# Patient Record
Sex: Female | Born: 1955 | Race: Black or African American | Hispanic: No | Marital: Single | Smoking: Former smoker
Health system: Southern US, Community
[De-identification: ages and names within clinical notes are randomized; demographics above are authoritative.]

## PROBLEM LIST (undated history)

## (undated) ENCOUNTER — Emergency Department (HOSPITAL_COMMUNITY): Discharge status: Against Medical Advice | Payer: No Typology Code available for payment source

## (undated) DIAGNOSIS — J45909 Unspecified asthma, uncomplicated: Secondary | ICD-10-CM

## (undated) DIAGNOSIS — J449 Chronic obstructive pulmonary disease, unspecified: Secondary | ICD-10-CM

## (undated) DIAGNOSIS — I10 Essential (primary) hypertension: Secondary | ICD-10-CM

## (undated) HISTORY — PX: BREAST REDUCTION SURGERY: SHX8

---

## 2021-08-02 ENCOUNTER — Ambulatory Visit (HOSPITAL_COMMUNITY)
Admission: EM | Admit: 2021-08-02 | Discharge: 2021-08-02 | Disposition: A | Discharge status: Home / Self Care | Payer: No Typology Code available for payment source | Attending: Physician Assistant | Admitting: Physician Assistant

## 2021-08-02 ENCOUNTER — Other Ambulatory Visit: Payer: Self-pay

## 2021-08-02 ENCOUNTER — Encounter (HOSPITAL_COMMUNITY): Payer: Self-pay

## 2021-08-02 ENCOUNTER — Ambulatory Visit (INDEPENDENT_AMBULATORY_CARE_PROVIDER_SITE_OTHER): Payer: No Typology Code available for payment source

## 2021-08-02 DIAGNOSIS — J439 Emphysema, unspecified: Secondary | ICD-10-CM

## 2021-08-02 DIAGNOSIS — R0602 Shortness of breath: Secondary | ICD-10-CM | POA: Insufficient documentation

## 2021-08-02 DIAGNOSIS — R002 Palpitations: Secondary | ICD-10-CM | POA: Insufficient documentation

## 2021-08-02 HISTORY — DX: Essential (primary) hypertension: I10

## 2021-08-02 HISTORY — DX: Chronic obstructive pulmonary disease, unspecified: J44.9

## 2021-08-02 LAB — COMPREHENSIVE METABOLIC PANEL
ALT: 12 U/L (ref 0–44)
AST: 17 U/L (ref 15–41)
Albumin: 3.5 g/dL (ref 3.5–5.0)
Alkaline Phosphatase: 59 U/L (ref 38–126)
Anion gap: 5 (ref 5–15)
BUN: 12 mg/dL (ref 8–23)
CO2: 32 mmol/L (ref 22–32)
Calcium: 9.9 mg/dL (ref 8.9–10.3)
Chloride: 102 mmol/L (ref 98–111)
Creatinine, Ser: 0.8 mg/dL (ref 0.44–1.00)
GFR, Estimated: >60 mL/min (ref >60)
Glucose, Bld: 97 mg/dL (ref 70–99)
Potassium: 3.2 mmol/L — ABNORMAL LOW (ref 3.5–5.1)
Sodium: 139 mmol/L (ref 135–145)
Total Bilirubin: 0.7 mg/dL (ref 0.3–1.2)
Total Protein: 6.6 g/dL (ref 6.5–8.1)

## 2021-08-02 LAB — POCT URINALYSIS DIPSTICK, ED / UC
Bilirubin Urine: NEGATIVE
Glucose, UA: NEGATIVE mg/dL
Hgb urine dipstick: NEGATIVE
Nitrite: NEGATIVE
Protein, ur: NEGATIVE mg/dL
Specific Gravity, Urine: 1.025 (ref 1.005–1.030)
Urobilinogen, UA: 0.2 mg/dL (ref 0.0–1.0)
pH: 6 (ref 5.0–8.0)

## 2021-08-02 LAB — CBC WITH DIFFERENTIAL/PLATELET
Abs Immature Granulocytes: 0.02 10*3/uL (ref 0.00–0.07)
Basophils Absolute: 0 10*3/uL (ref 0.0–0.1)
Basophils Relative: 0 %
Eosinophils Absolute: 0.3 10*3/uL (ref 0.0–0.5)
Eosinophils Relative: 4 %
HCT: 39.1 % (ref 36.0–46.0)
Hemoglobin: 13.9 g/dL (ref 12.0–15.0)
Immature Granulocytes: 0 %
Lymphocytes Relative: 37 %
Lymphs Abs: 2.6 10*3/uL (ref 0.7–4.0)
MCH: 29.3 pg (ref 26.0–34.0)
MCHC: 35.5 g/dL (ref 30.0–36.0)
MCV: 82.3 fL (ref 80.0–100.0)
Monocytes Absolute: 0.7 10*3/uL (ref 0.1–1.0)
Monocytes Relative: 10 %
Neutro Abs: 3.3 10*3/uL (ref 1.7–7.7)
Neutrophils Relative %: 49 %
Platelets: 250 10*3/uL (ref 150–400)
RBC: 4.75 MIL/uL (ref 3.87–5.11)
RDW: 14 % (ref 11.5–15.5)
WBC: 6.9 10*3/uL (ref 4.0–10.5)
nRBC: 0 % (ref 0.0–0.2)

## 2021-08-02 LAB — TSH: TSH: 0.916 u[IU]/mL (ref 0.350–4.500)

## 2021-08-02 MED ORDER — BUDESONIDE-FORMOTEROL FUMARATE 80-4.5 MCG/ACT IN AERO: 2.0000 | INHALATION_SPRAY | Freq: Two times a day (BID) | RESPIRATORY_TRACT | 0 refills | Status: DC

## 2021-08-02 NOTE — Discharge Instructions (Signed)
Your x-ray showed COPD but was otherwise normal.  Your EKG was normal.  I am going to start you on a medicine to help with your COPD stating this can be contributing to your symptoms.  Please monitor prednisone as a cause of your symptoms and use this sparingly.  We will contact you if your lab work is abnormal.  Make sure you are drinking plenty of fluid.  Follow-up with PCP.  If anything worsens and you have shortness of breath, worsening cough, fever, heart racing, lightheadedness, feeling like you are to pass out you need to go to the emergency room.

## 2021-08-02 NOTE — ED Provider Notes (Signed)
[facility]    CSN: [id number] Arrival date & time: 08/02/21  1520      History   Chief Complaint No chief complaint on file.   HPI Helen Vasquez is a 64 y.o. female.   Patient presents today for evaluation of several days of elevated heart rate.  Reports that she is recently diagnosed with COPD but is not currently taking any bronchodilating medications/inhalers.  She has been prescribed prednisone which she uses as needed with last dose approximately 3 to 4 days ago.  She does report ongoing shortness of breath but is unsure if this is related to her COPD or something else.  She denies any chest pain, lightheadedness, syncope, dizziness, nausea, vomiting.  She denies history of thyroid condition or anemia.  Denies any abnormal uterine bleeding, melena, hematochezia, bruising.  She denies history of arrhythmia or cardiovascular disease.  She denies any decongestant use, increased caffeine consumption.  Denies additional medication or dietary changes.   Past Medical History:  Diagnosis Date   COPD (chronic obstructive pulmonary disease) (HCC)    High blood pressure     There are no problems to display for this patient.   History reviewed. No pertinent surgical history.  OB History   No obstetric history on file.      Home Medications    Prior to Admission medications   Medication Sig Start Date End Date Taking? Authorizing Provider  budesonide-formoterol (SYMBICORT) 80-4.5 MCG/ACT inhaler Inhale 2 puffs into the lungs in the morning and at bedtime. 08/02/21  Yes Andras Grunewald, Noberto Retort, PA-C    Family History History reviewed. No pertinent family history.  Social History Social History   Tobacco Use   Smoking status: Some Days    Types: Cigarettes   Smokeless tobacco: Never  Substance Use Topics   Alcohol use: Never   Drug use: Never     Allergies   Patient has no known allergies.   Review of Systems Review of Systems  Constitutional:  Positive for  activity change. Negative for appetite change, fatigue and fever.  HENT:  Negative for congestion, sinus pressure, sneezing and sore throat.   Respiratory:  Positive for shortness of breath. Negative for cough.   Cardiovascular:  Positive for palpitations. Negative for chest pain.  Gastrointestinal:  Negative for abdominal pain, diarrhea, nausea and vomiting.  Neurological:  Negative for dizziness, seizures, facial asymmetry, weakness, light-headedness, numbness and headaches.    Physical Exam Triage Vital Signs ED Triage Vitals  Enc Vitals Group     BP 08/02/21 1642 140/72     Pulse Rate 08/02/21 1642 100     Resp 08/02/21 1642 16     Temp --      Temp src --      SpO2 08/02/21 1642 92 %     Weight --      Height --      Head Circumference --      Peak Flow --      Pain Score 08/02/21 1644 0     Pain Loc --      Pain Edu? --      Excl. in GC? --    No data found.  Updated Vital Signs BP 140/72 (BP Location: Left Arm)   Pulse 100   Temp 98.2 F (36.8 C) (Oral)   Resp 16   SpO2 92%   Visual Acuity Right Eye Distance:   Left Eye Distance:   Bilateral Distance:    Right Eye Near:  Left Eye Near:    Bilateral Near:     Physical Exam Vitals reviewed.  Constitutional:      General: She is awake. She is not in acute distress.    Appearance: Normal appearance. She is well-developed. She is not ill-appearing.     Comments: Very pleasant female appears stated age in no acute distress sitting comfortably in exam room  HENT:     Head: Normocephalic and atraumatic.     Mouth/Throat:     Mouth: Mucous membranes are moist.  Eyes:     General: Lids are normal.  Cardiovascular:     Rate and Rhythm: Regular rhythm. Tachycardia present.     Heart sounds: Normal heart sounds, S1 normal and S2 normal. No murmur heard. Pulmonary:     Effort: Pulmonary effort is normal.     Breath sounds: Wheezing present. No rhonchi or rales.     Comments: Scattered wheezing Abdominal:      Palpations: Abdomen is soft.     Tenderness: There is no abdominal tenderness.  Psychiatric:        Behavior: Behavior is cooperative.     UC Treatments / Results  Labs (all labs ordered are listed, but only abnormal results are displayed) Labs Reviewed  POCT URINALYSIS DIPSTICK, ED / UC - Abnormal; Notable for the following components:      Result Value   Ketones, ur TRACE (*)    Leukocytes,Ua SMALL (*)    All other components within normal limits  URINE CULTURE  CBC WITH DIFFERENTIAL/PLATELET  COMPREHENSIVE METABOLIC PANEL  TSH    EKG   Radiology DG Chest 2 View  Result Date: 08/02/2021 CLINICAL DATA:  Short of breath, palpitations EXAM: CHEST - 2 VIEW COMPARISON:  None. FINDINGS: Frontal and lateral views of the chest demonstrate an unremarkable cardiac silhouette. Lungs are hyperinflated, with mild diffuse interstitial prominence most consistent with emphysema. No focal airspace disease, effusion, or pneumothorax. No acute bony abnormalities. IMPRESSION: 1. Emphysema.  No acute airspace disease. Electronically Signed   By: Sharlet Salina M.D.   On: 08/02/2021 17:18    Procedures Procedures (including critical care time)  Medications Ordered in UC Medications - No data to display  Initial Impression / Assessment and Plan / UC Course  I have reviewed the triage vital signs and the nursing notes.  Pertinent labs & imaging results that were available during my care of the patient were reviewed by me and considered in my medical decision making (see chart for details).     EKG obtained showed normal sinus rhythm with left axis deviation and ventricular rate of 91 bpm with no ischemic changes and late R wave progression; no previous to compare.  Chest x-ray was obtained that showed emphysema without acute changes.  UA showed no significant evidence of dehydration but did show small leukocyte esterase.  Patient denies any significant symptoms so we will send this for culture  before starting antibiotics.  TSH, CBC, CMP obtained today-results pending.  Suspect symptoms are related to prednisone she was encouraged to limit use of this medication.  We will start her on a LABA/inhaled corticosteroid combination (Symbicort) for COPD.  She was instructed to rinse her mouth following use of this medication to prevent thrush.  She does have a primary care provider at [facility] was strongly encouraged to follow-up with them as soon as possible.  Discussed at length alarm symptoms that warrant emergent evaluation.  Strict return precautions given to which she expressed understanding.  Final Clinical Impressions(s) / UC Diagnoses   Final diagnoses:  SOB (shortness of breath)  Palpitations  Pulmonary emphysema, unspecified emphysema type [facility])     Discharge Instructions      Your x-ray showed COPD but was otherwise normal.  Your EKG was normal.  I am going to start you on a medicine to help with your COPD stating this can be contributing to your symptoms.  Please monitor prednisone as a cause of your symptoms and use this sparingly.  We will contact you if your lab work is abnormal.  Make sure you are drinking plenty of fluid.  Follow-up with PCP.  If anything worsens and you have shortness of breath, worsening cough, fever, heart racing, lightheadedness, feeling like you are to pass out you need to go to the emergency room.     ED Prescriptions     Medication Sig Dispense Auth. Provider   budesonide-formoterol (SYMBICORT) 80-4.5 MCG/ACT inhaler Inhale 2 puffs into the lungs in the morning and at bedtime. 10.2 g Neilah Fulwider K, PA-C      PDMP not reviewed this encounter.   Jeani Hawking, PA-C 08/02/21 1754

## 2021-08-02 NOTE — ED Triage Notes (Signed)
Pt presents with concerns of her heart rate being elevated. Pt states her HR was at 113 yesterday and stated a 109 in the morning of yesterday.

## 2021-08-04 LAB — URINE CULTURE

## 2021-08-12 ENCOUNTER — Emergency Department (HOSPITAL_COMMUNITY): Payer: No Typology Code available for payment source

## 2021-08-12 ENCOUNTER — Encounter (HOSPITAL_COMMUNITY): Payer: Self-pay | Admitting: Emergency Medicine

## 2021-08-12 ENCOUNTER — Emergency Department (HOSPITAL_COMMUNITY)
Admission: EM | Admit: 2021-08-12 | Discharge: 2021-08-13 | Disposition: A | Discharge status: Home / Self Care | Payer: No Typology Code available for payment source | Attending: Emergency Medicine | Admitting: Emergency Medicine

## 2021-08-12 ENCOUNTER — Other Ambulatory Visit: Payer: Self-pay

## 2021-08-12 DIAGNOSIS — I10 Essential (primary) hypertension: Secondary | ICD-10-CM | POA: Insufficient documentation

## 2021-08-12 DIAGNOSIS — Z7951 Long term (current) use of inhaled steroids: Secondary | ICD-10-CM | POA: Diagnosis not present

## 2021-08-12 DIAGNOSIS — J449 Chronic obstructive pulmonary disease, unspecified: Secondary | ICD-10-CM | POA: Diagnosis not present

## 2021-08-12 DIAGNOSIS — H53123 Transient visual loss, bilateral: Secondary | ICD-10-CM | POA: Insufficient documentation

## 2021-08-12 DIAGNOSIS — F17210 Nicotine dependence, cigarettes, uncomplicated: Secondary | ICD-10-CM | POA: Insufficient documentation

## 2021-08-12 DIAGNOSIS — H539 Unspecified visual disturbance: Secondary | ICD-10-CM

## 2021-08-12 LAB — BASIC METABOLIC PANEL
Anion gap: 8 (ref 5–15)
BUN: 11 mg/dL (ref 8–23)
CO2: 32 mmol/L (ref 22–32)
Calcium: 10 mg/dL (ref 8.9–10.3)
Chloride: 97 mmol/L — ABNORMAL LOW (ref 98–111)
Creatinine, Ser: 1.04 mg/dL — ABNORMAL HIGH (ref 0.44–1.00)
GFR, Estimated: 60 mL/min — ABNORMAL LOW (ref >60)
Glucose, Bld: 121 mg/dL — ABNORMAL HIGH (ref 70–99)
Potassium: 3.2 mmol/L — ABNORMAL LOW (ref 3.5–5.1)
Sodium: 137 mmol/L (ref 135–145)

## 2021-08-12 LAB — CBC WITH DIFFERENTIAL/PLATELET
Abs Immature Granulocytes: 0.07 10*3/uL (ref 0.00–0.07)
Basophils Absolute: 0 10*3/uL (ref 0.0–0.1)
Basophils Relative: 0 %
Eosinophils Absolute: 0.3 10*3/uL (ref 0.0–0.5)
Eosinophils Relative: 2 %
HCT: 41.4 % (ref 36.0–46.0)
Hemoglobin: 14.3 g/dL (ref 12.0–15.0)
Immature Granulocytes: 1 %
Lymphocytes Relative: 26 %
Lymphs Abs: 3.3 10*3/uL (ref 0.7–4.0)
MCH: 28.7 pg (ref 26.0–34.0)
MCHC: 34.5 g/dL (ref 30.0–36.0)
MCV: 83.1 fL (ref 80.0–100.0)
Monocytes Absolute: 1 10*3/uL (ref 0.1–1.0)
Monocytes Relative: 8 %
Neutro Abs: 8.2 10*3/uL — ABNORMAL HIGH (ref 1.7–7.7)
Neutrophils Relative %: 63 %
Platelets: 268 10*3/uL (ref 150–400)
RBC: 4.98 MIL/uL (ref 3.87–5.11)
RDW: 14.1 % (ref 11.5–15.5)
WBC: 12.8 10*3/uL — ABNORMAL HIGH (ref 4.0–10.5)
nRBC: 0 % (ref 0.0–0.2)

## 2021-08-12 LAB — TROPONIN I (HIGH SENSITIVITY)
Troponin I (High Sensitivity): 14 ng/L (ref <18)
Troponin I (High Sensitivity): 19 ng/L — ABNORMAL HIGH (ref <18)

## 2021-08-12 LAB — BRAIN NATRIURETIC PEPTIDE: B Natriuretic Peptide: 66.7 pg/mL (ref 0.0–100.0)

## 2021-08-12 NOTE — ED Triage Notes (Signed)
Pt c/o hypertension and tachycardia. Reports HR 130 and BP 160/66. Hx HTN, states she is taking her meds as prescribed. Denies chest pain/shortness of breath/headache. C/o dizziness.

## 2021-08-12 NOTE — ED Provider Notes (Signed)
Emergency Medicine Provider Triage Evaluation Note  Helen Vasquez , a 65 y.o. female  was evaluated in triage.  Pt complains of hypertension and tachycardia.  Was watching TV, felt like her heart was racing.  Had a headache that started suddenly and was associated with blurred vision.  No chest pain, does feel short of breath.  Was seen in urgent care with similar symptoms 10 days ago, TSH was normal at that time.  No gross electrolyte derangement, was diagnosed with COPD.  No cardiac history, has not seen a cardiologist.  Review of Systems  Positive: Above Negative: Syncope  Physical Exam  BP (!) 155/77 (BP Location: Right Arm)   Pulse (!) 124   Temp 98.3 F (36.8 C) (Oral)   Resp 18   SpO2 96%  Gen:   Awake, no distress   Resp:  Normal effort  MSK:   Moves extremities without difficulty  Other:  Patient is tachycardic, rhythm is regular.  Medical Decision Making  Medically screening exam initiated at 4:59 PM.  Appropriate orders placed.  Helen Vasquez was informed that the remainder of the evaluation will be completed by another provider, this initial triage assessment does not replace that evaluation, and the importance of remaining in the ED until their evaluation is complete.  EKG in triage showed sinus tachycardia with PACs.  We will get shortness of breath labs, troponin to check for signs of heart strain.     Theron Arista, PA-C 08/12/21 1701    Lorre Nick, MD 08/13/21 [phone]

## 2021-08-13 LAB — TROPONIN I (HIGH SENSITIVITY): Troponin I (High Sensitivity): 14 ng/L (ref <18)

## 2021-08-13 MED ORDER — AEROCHAMBER PLUS FLO-VU MISC
1.0000 | Freq: Once | Status: DC
  Filled 2021-08-13: qty 1

## 2021-08-13 NOTE — ED Notes (Signed)
EDP at bedside  

## 2021-08-13 NOTE — ED Provider Notes (Signed)
[facility] EMERGENCY DEPARTMENT Provider Note   CSN: [id number] Arrival date & time: 08/12/21  1559     History Chief Complaint  Patient presents with   Hypertension    Helen Vasquez is a 65 y.o. female.   65 year old female with a history of COPD, hypertension presents to the emergency department for evaluation.  She is a very difficult historian.  Complains, to me, of onset of bilateral blurry vision when watching TV.  Symptoms began around 1530.  They began to improve as she got in her car to come to the emergency department.  While her vision was blurry, she reports checking her vital signs.  She noted that her heart rate was elevated on her home monitor.  This also made her concerned.  Does note that her heart rate will fluctuate at times when she checks her blood pressure at home.  Reported palpitations with her symptoms in triage, but denies this on multiple occasions during my questioning.  States that she has shortness of breath at baseline from her COPD and did not feel this was worse when her vision was blurry.  No associated fevers, headaches, complete vision loss, syncope or near syncope, chest pain.  Was recently placed on prednisone for suspected URI symptoms after being seen at urgent care.  No other recent medication changes, but she expresses difficulty taking her blood pressure medication consistently.  She does endorse taking her medication this morning.  Presently the patient reports to be asymptomatic.  The history is provided by the patient. No language interpreter was used.  Hypertension      Past Medical History:  Diagnosis Date   COPD (chronic obstructive pulmonary disease) (HCC)    High blood pressure     There are no problems to display for this patient.   History reviewed. No pertinent surgical history.   OB History   No obstetric history on file.     No family history on file.  Social History   Tobacco Use   Smoking  status: Some Days    Types: Cigarettes   Smokeless tobacco: Never  Substance Use Topics   Alcohol use: Never   Drug use: Never    Home Medications Prior to Admission medications   Medication Sig Start Date End Date Taking? Authorizing Provider  budesonide-formoterol (SYMBICORT) 80-4.5 MCG/ACT inhaler Inhale 2 puffs into the lungs in the morning and at bedtime. 08/02/21   Raspet, Noberto Retort, PA-C    Allergies    Ace inhibitors and Penicillin g  Review of Systems   Review of Systems Ten systems reviewed and are negative for acute change, except as noted in the HPI.    Physical Exam Updated Vital Signs BP (!) 148/83 (BP Location: Right Arm)   Pulse 91   Temp 98.6 F (37 C)   Resp 18   SpO2 96%   Physical Exam Vitals and nursing note reviewed.  Constitutional:      General: She is not in acute distress.    Appearance: She is well-developed. She is not diaphoretic.     Comments: Obese AA female. Pleasant.  HENT:     Head: Normocephalic and atraumatic.     Right Ear: External ear normal.     Left Ear: External ear normal.  Eyes:     General: No scleral icterus.    Extraocular Movements: Extraocular movements intact.     Conjunctiva/sclera: Conjunctivae normal.  Cardiovascular:     Rate and Rhythm: Normal rate and regular  rhythm.     Pulses: Normal pulses.  Pulmonary:     Effort: Pulmonary effort is normal. No respiratory distress.     Breath sounds: No stridor. No rales.     Comments: Minimal, scattered expiratory wheezes.  No tachypnea or dyspnea.  No rales or rhonchi. Musculoskeletal:        General: Normal range of motion.     Cervical back: Normal range of motion.  Skin:    General: Skin is warm and dry.     Coloration: Skin is not pale.     Findings: No erythema or rash.  Neurological:     Mental Status: She is alert and oriented to person, place, and time.     Coordination: Coordination normal.     Comments: GCS 15. Speech is goal oriented. No cranial nerve  deficits or other focal deficit appreciated. Patient moves extremities without ataxia. Patient ambulatory with steady gait.  Psychiatric:        Behavior: Behavior normal.    ED Results / Procedures / Treatments   Labs (all labs ordered are listed, but only abnormal results are displayed) Labs Reviewed  BASIC METABOLIC PANEL - Abnormal; Notable for the following components:      Result Value   Potassium 3.2 (*)    Chloride 97 (*)    Glucose, Bld 121 (*)    Creatinine, Ser 1.04 (*)    GFR, Estimated 60 (*)    All other components within normal limits  CBC WITH DIFFERENTIAL/PLATELET - Abnormal; Notable for the following components:   WBC 12.8 (*)    Neutro Abs 8.2 (*)    All other components within normal limits  TROPONIN I (HIGH SENSITIVITY) - Abnormal; Notable for the following components:   Troponin I (High Sensitivity) 19 (*)    All other components within normal limits  BRAIN NATRIURETIC PEPTIDE  TROPONIN I (HIGH SENSITIVITY)  TROPONIN I (HIGH SENSITIVITY)    EKG EKG Interpretation  Date/Time:  Monday August 12 2021 16:54:40 EST Ventricular Rate:  117 PR Interval:  166 QRS Duration: 84 QT Interval:  324 QTC Calculation: 451 R Axis:   -45 Text Interpretation: Sinus tachycardia with Premature supraventricular complexes Left axis deviation Low voltage QRS Abnormal ECG Confirmed by Gerlene Fee [phone]) on 08/13/2021 2:22:43 AM  Radiology DG Chest 2 View  Result Date: 08/12/2021 CLINICAL DATA:  Shortness of breath. EXAM: CHEST - 2 VIEW COMPARISON:  Chest x-ray 08/02/2021 FINDINGS: The heart size and mediastinal contours are within normal limits. Both lungs are clear. The visualized skeletal structures are unremarkable. IMPRESSION: No active cardiopulmonary disease. Electronically Signed   By: Ronney Asters M.D.   On: 08/12/2021 17:37   CT Head Wo Contrast  Result Date: 08/12/2021 CLINICAL DATA:  Headache. EXAM: CT HEAD WITHOUT CONTRAST TECHNIQUE: Contiguous axial  images were obtained from the base of the skull through the vertex without intravenous contrast. COMPARISON:  None. FINDINGS: Brain: The ventricles and sulci appropriate size for patient's age. Mild periventricular and deep white matter chronic microvascular ischemic changes noted. There is no acute intracranial hemorrhage. No mass effect or midline shift. No extra-axial fluid collection. Vascular: No hyperdense vessel or unexpected calcification. Skull: Normal. Negative for fracture or focal lesion. Sinuses/Orbits: No acute finding. Other: None IMPRESSION: No acute intracranial pathology. Electronically Signed   By: Anner Crete M.D.   On: 08/12/2021 19:18    Procedures Procedures   Medications Ordered in ED Medications  aerochamber plus with mask device 1 each (has no  administration in time range)    ED Course  I have reviewed the triage vital signs and the nursing notes.  Pertinent labs & imaging results that were available during my care of the patient were reviewed by me and considered in my medical decision making (see chart for details).    MDM Rules/Calculators/A&P                           65 year old female presents to the emergency department for evaluation.  Initially reporting in triage complaints of high blood pressure and an episode of palpitations prior to arrival.  During my encounter, she reports complaints of bilateral blurry vision which transiently resolved.  She is presently asymptomatic.  Very difficult historian given inconsistency of complaints.  Overall, with no focal neurologic deficit.  Had screening labs and a head CT completed in triage.  These have been reviewed and are reassuring.  Third troponin ordered given change from 14 > 19. Additional delta stable at, again, 14.  Given reassuring evaluation in the emergency department, feel the patient can follow-up further with her primary care doctor.  Have advised she continue her daily prescribed medications.   Patient seen in conjunction with MD Bero who has assessed the patient at bedside and is agreeable with plan.  Patient discharged in stable condition.   Final Clinical Impression(s) / ED Diagnoses Final diagnoses:  Transient visual disturbance    Rx / DC Orders ED Discharge Orders     None        Antonietta Breach, PA-C 08/21/21 1629    Maudie Flakes, MD 08/22/21 [phone]

## 2021-08-13 NOTE — Discharge Instructions (Signed)
Your evaluation in the emergency department today was reassuring.  We recommend that you follow-up with a primary care doctor.  Keep your scheduled appointment today for evaluation.  You may return for new or concerning symptoms.

## 2021-09-12 ENCOUNTER — Observation Stay (HOSPITAL_COMMUNITY)
Admission: EM | Admit: 2021-09-12 | Discharge: 2021-09-13 | Disposition: A | Discharge status: Home / Self Care | Payer: No Typology Code available for payment source | Attending: Internal Medicine | Admitting: Internal Medicine

## 2021-09-12 ENCOUNTER — Emergency Department (HOSPITAL_COMMUNITY): Payer: No Typology Code available for payment source

## 2021-09-12 ENCOUNTER — Other Ambulatory Visit: Payer: Self-pay

## 2021-09-12 ENCOUNTER — Encounter (HOSPITAL_COMMUNITY): Payer: Self-pay | Admitting: Emergency Medicine

## 2021-09-12 DIAGNOSIS — Z87891 Personal history of nicotine dependence: Secondary | ICD-10-CM | POA: Insufficient documentation

## 2021-09-12 DIAGNOSIS — J45909 Unspecified asthma, uncomplicated: Secondary | ICD-10-CM | POA: Insufficient documentation

## 2021-09-12 DIAGNOSIS — I10 Essential (primary) hypertension: Secondary | ICD-10-CM | POA: Insufficient documentation

## 2021-09-12 DIAGNOSIS — J441 Chronic obstructive pulmonary disease with (acute) exacerbation: Secondary | ICD-10-CM | POA: Diagnosis not present

## 2021-09-12 DIAGNOSIS — Z20822 Contact with and (suspected) exposure to covid-19: Secondary | ICD-10-CM | POA: Insufficient documentation

## 2021-09-12 DIAGNOSIS — Z79899 Other long term (current) drug therapy: Secondary | ICD-10-CM | POA: Insufficient documentation

## 2021-09-12 DIAGNOSIS — R0902 Hypoxemia: Secondary | ICD-10-CM

## 2021-09-12 DIAGNOSIS — R0602 Shortness of breath: Secondary | ICD-10-CM

## 2021-09-12 DIAGNOSIS — E872 Acidosis, unspecified: Secondary | ICD-10-CM

## 2021-09-12 DIAGNOSIS — E876 Hypokalemia: Secondary | ICD-10-CM

## 2021-09-12 HISTORY — DX: Unspecified asthma, uncomplicated: J45.909

## 2021-09-12 LAB — URINALYSIS, ROUTINE W REFLEX MICROSCOPIC
Bilirubin Urine: NEGATIVE
Glucose, UA: NEGATIVE mg/dL
Hgb urine dipstick: NEGATIVE
Ketones, ur: NEGATIVE mg/dL
Leukocytes,Ua: NEGATIVE
Nitrite: NEGATIVE
Protein, ur: NEGATIVE mg/dL
Specific Gravity, Urine: 1.004 — ABNORMAL LOW (ref 1.005–1.030)
pH: 8 (ref 5.0–8.0)

## 2021-09-12 LAB — COMPREHENSIVE METABOLIC PANEL
ALT: 14 U/L (ref 0–44)
AST: 15 U/L (ref 15–41)
Albumin: 4 g/dL (ref 3.5–5.0)
Alkaline Phosphatase: 53 U/L (ref 38–126)
Anion gap: 8 (ref 5–15)
BUN: 8 mg/dL (ref 8–23)
CO2: 31 mmol/L (ref 22–32)
Calcium: 9.3 mg/dL (ref 8.9–10.3)
Chloride: 103 mmol/L (ref 98–111)
Creatinine, Ser: 0.75 mg/dL (ref 0.44–1.00)
GFR, Estimated: >60 mL/min (ref >60)
Glucose, Bld: 122 mg/dL — ABNORMAL HIGH (ref 70–99)
Potassium: 3.2 mmol/L — ABNORMAL LOW (ref 3.5–5.1)
Sodium: 142 mmol/L (ref 135–145)
Total Bilirubin: 0.9 mg/dL (ref 0.3–1.2)
Total Protein: 7 g/dL (ref 6.5–8.1)

## 2021-09-12 LAB — CBC WITH DIFFERENTIAL/PLATELET
Abs Immature Granulocytes: 0.02 10*3/uL (ref 0.00–0.07)
Basophils Absolute: 0 10*3/uL (ref 0.0–0.1)
Basophils Relative: 0 %
Eosinophils Absolute: 0.3 10*3/uL (ref 0.0–0.5)
Eosinophils Relative: 4 %
HCT: 40.7 % (ref 36.0–46.0)
Hemoglobin: 13.9 g/dL (ref 12.0–15.0)
Immature Granulocytes: 0 %
Lymphocytes Relative: 26 %
Lymphs Abs: 2 10*3/uL (ref 0.7–4.0)
MCH: 28.7 pg (ref 26.0–34.0)
MCHC: 34.2 g/dL (ref 30.0–36.0)
MCV: 83.9 fL (ref 80.0–100.0)
Monocytes Absolute: 0.6 10*3/uL (ref 0.1–1.0)
Monocytes Relative: 8 %
Neutro Abs: 4.7 10*3/uL (ref 1.7–7.7)
Neutrophils Relative %: 62 %
Platelets: 273 10*3/uL (ref 150–400)
RBC: 4.85 MIL/uL (ref 3.87–5.11)
RDW: 14.6 % (ref 11.5–15.5)
WBC: 7.7 10*3/uL (ref 4.0–10.5)
nRBC: 0 % (ref 0.0–0.2)

## 2021-09-12 LAB — RESP PANEL BY RT-PCR (FLU A&B, COVID) ARPGX2
Influenza A by PCR: NEGATIVE
Influenza B by PCR: NEGATIVE
SARS Coronavirus 2 by RT PCR: NEGATIVE

## 2021-09-12 LAB — LACTIC ACID, PLASMA: Lactic Acid, Venous: 2.2 mmol/L (ref 0.5–1.9)

## 2021-09-12 MED ORDER — METHYLPREDNISOLONE SODIUM SUCC 125 MG IJ SOLR
80.0000 mg | Freq: Two times a day (BID) | INTRAMUSCULAR | Status: DC
  Administered 2021-09-13: 10:00:00 80 mg via INTRAVENOUS
  Filled 2021-09-12: qty 2

## 2021-09-12 MED ORDER — IPRATROPIUM-ALBUTEROL 0.5-2.5 (3) MG/3ML IN SOLN
3.0000 mL | Freq: Four times a day (QID) | RESPIRATORY_TRACT | Status: DC
  Administered 2021-09-13 (×2): 3 mL via RESPIRATORY_TRACT
  Filled 2021-09-12 (×2): qty 3

## 2021-09-12 MED ORDER — IOHEXOL 350 MG/ML SOLN
80.0000 mL | Freq: Once | INTRAVENOUS | Status: AC | PRN
  Administered 2021-09-12: 22:00:00 80 mL via INTRAVENOUS

## 2021-09-12 MED ORDER — IPRATROPIUM-ALBUTEROL 0.5-2.5 (3) MG/3ML IN SOLN
3.0000 mL | Freq: Once | RESPIRATORY_TRACT | Status: AC
  Administered 2021-09-12: 15:00:00 3 mL via RESPIRATORY_TRACT
  Filled 2021-09-12: qty 3

## 2021-09-12 MED ORDER — ACETAMINOPHEN 650 MG RE SUPP: 650.0000 mg | Freq: Four times a day (QID) | RECTAL | Status: DC | PRN

## 2021-09-12 MED ORDER — ACETAMINOPHEN 325 MG PO TABS: 650.0000 mg | ORAL_TABLET | Freq: Four times a day (QID) | ORAL | Status: DC | PRN

## 2021-09-12 MED ORDER — ALBUTEROL SULFATE (2.5 MG/3ML) 0.083% IN NEBU: 2.5000 mg | INHALATION_SOLUTION | RESPIRATORY_TRACT | Status: DC | PRN

## 2021-09-12 MED ORDER — POTASSIUM CHLORIDE CRYS ER 20 MEQ PO TBCR
20.0000 meq | EXTENDED_RELEASE_TABLET | Freq: Once | ORAL | Status: AC
  Administered 2021-09-12: 16:00:00 20 meq via ORAL
  Filled 2021-09-12: qty 1

## 2021-09-12 NOTE — ED Triage Notes (Signed)
Per [facility] pt transported from [facility]. States she has been having increased shortness of breath x 3 days that worsened while in the shower this morning. Patient had chest x-ray at [facility] showing pneumonia. Given 125mg  solumedrol and 1 duoneb en route.

## 2021-09-12 NOTE — ED Provider Notes (Signed)
[facility]-EMERGENCY DEPT Provider Note   CSN: [id number] Arrival date & time: 09/12/21  1358     History Chief Complaint  Patient presents with   Shortness of Breath    Helen Vasquez is a 65 y.o. female.   Shortness of Breath Associated symptoms: wheezing    65 year old female with a history of asthma and COPD not on home O2, hypertension who presents to the emergency department with roughly 2 to 3 days of cough and worsening shortness of breath.  The patient did have a sick contact at home with in the form of a grandson who had a viral upper respiratory illness and left the house 2 days ago.  The patient endorses worsening cough productive of yellow sputum and worsening dyspnea that has progressed.  She went to urgent care this morning where a chest x-ray revealed bilateral infiltrates Durning for a lower respiratory infection.  She was transported by EMS to the [facility] emergency department due to acute hypoxic respiratory failure.  She was administered 125 mg of IV Solu-Medrol and 1 DuoNeb in route.  She arrived complaining of mild dyspnea, denies any chest pain, fevers or chills.  Past Medical History:  Diagnosis Date   Asthma    COPD (chronic obstructive pulmonary disease) (HCC)    High blood pressure     There are no problems to display for this patient.   History reviewed. No pertinent surgical history.   OB History   No obstetric history on file.     History reviewed. No pertinent family history.  Social History   Tobacco Use   Smoking status: Former    Types: Cigarettes   Smokeless tobacco: Never  Substance Use Topics   Alcohol use: Never   Drug use: Never    Home Medications Prior to Admission medications   Medication Sig Start Date End Date Taking? Authorizing Provider  budesonide-formoterol (SYMBICORT) 80-4.5 MCG/ACT inhaler Inhale 2 puffs into the lungs in the morning and at bedtime. 08/02/21   Raspet, Noberto Retort, PA-C     Allergies    Ace inhibitors and Penicillin g  Review of Systems   Review of Systems  Respiratory:  Positive for shortness of breath and wheezing.   All other systems reviewed and are negative.  Physical Exam Updated Vital Signs BP (!) 146/98    Pulse (!) 114    Temp 97.9 F (36.6 C)    Resp 18    Ht 5\' 3"  (1.6 m)    Wt 90.7 kg    SpO2 95%    BMI 35.43 kg/m   Physical Exam Vitals and nursing note reviewed.  Constitutional:      General: She is not in acute distress.    Appearance: She is well-developed.  HENT:     Head: Normocephalic and atraumatic.  Eyes:     Conjunctiva/sclera: Conjunctivae normal.     Pupils: Pupils are equal, round, and reactive to light.  Cardiovascular:     Rate and Rhythm: Normal rate and regular rhythm.     Heart sounds: No murmur heard. Pulmonary:     Effort: Pulmonary effort is normal. Tachypnea present. No respiratory distress.     Breath sounds: Examination of the right-upper field reveals wheezing. Examination of the left-upper field reveals wheezing. Examination of the right-middle field reveals wheezing. Examination of the left-middle field reveals wheezing. Examination of the right-lower field reveals wheezing. Examination of the left-lower field reveals wheezing. Wheezing present.     Comments: Mild  diffuse expiratory wheezing present Abdominal:     General: There is no distension.     Palpations: Abdomen is soft.     Tenderness: There is no abdominal tenderness. There is no guarding.  Musculoskeletal:        General: No swelling, deformity or signs of injury.     Cervical back: Neck supple.  Skin:    General: Skin is warm and dry.     Capillary Refill: Capillary refill takes less than 2 seconds.     Findings: No lesion or rash.  Neurological:     General: No focal deficit present.     Mental Status: She is alert. Mental status is at baseline.  Psychiatric:        Mood and Affect: Mood normal.    ED Results / Procedures /  Treatments   Labs (all labs ordered are listed, but only abnormal results are displayed) Labs Reviewed  COMPREHENSIVE METABOLIC PANEL - Abnormal; Notable for the following components:      Result Value   Potassium 3.2 (*)    Glucose, Bld 122 (*)    All other components within normal limits  URINALYSIS, ROUTINE W REFLEX MICROSCOPIC - Abnormal; Notable for the following components:   Color, Urine COLORLESS (*)    Specific Gravity, Urine 1.004 (*)    All other components within normal limits  RESP PANEL BY RT-PCR (FLU A&B, COVID) [id number]  CBC WITH DIFFERENTIAL/PLATELET  LACTIC ACID, PLASMA  LACTIC ACID, PLASMA    EKG EKG Interpretation  Date/Time:  Thursday September 12 2021 14:19:20 EST Ventricular Rate:  116 PR Interval:  163 QRS Duration: 88 QT Interval:  325 QTC Calculation: 452 R Axis:   -29 Text Interpretation: Sinus tachycardia Paired ventricular premature complexes Borderline left axis deviation Low voltage, precordial leads Confirmed by Ernie Avena ([id number]) on 09/12/2021 2:53:17 PM  Radiology No results found.  Procedures Procedures   Medications Ordered in ED Medications  ipratropium-albuterol (DUONEB) 0.5-2.5 (3) MG/3ML nebulizer solution 3 mL (has no administration in time range)    ED Course  I have reviewed the triage vital signs and the nursing notes.  Pertinent labs & imaging results that were available during my care of the patient were reviewed by me and considered in my medical decision making (see chart for details).    MDM Rules/Calculators/A&P                          65 year old female with a history of asthma and COPD not on home O2, hypertension who presents to the emergency department with roughly 2 to 3 days of cough and worsening shortness of breath.  The patient did have a sick contact at home with in the form of a grandson who had a viral upper respiratory illness and left the house 2 days ago.  The patient endorses worsening cough  productive of yellow sputum and worsening dyspnea that has progressed.  She went to urgent care this morning where a chest x-ray revealed bilateral infiltrates Durning for a lower respiratory infection.  She was transported by EMS to the [facility] emergency department due to acute hypoxic respiratory failure.  She was administered 125 mg of IV Solu-Medrol and 1 DuoNeb in route.  She arrived complaining of mild dyspnea, denies any chest pain, fevers or chills.  On arrival, the patient was afebrile, tachycardic P1 19 status post DuoNeb, hypoxic with oxygen saturations at 90 to 91% on room air, subsequently placed  back on 2 L O2 via nasal cannula.  Concern for viral respiratory infection resulting in COPD exacerbation.  The patient is already status post Solu-Medrol and 1 DuoNeb.  We will continue to treat with an additional DuoNeb x2.  COVID-19 and influenza PCR testing collected in addition to screening labs.  The patient is already had a chest x-ray which revealed no focal consolidation to suggest pneumonia.  At time of signout to reassess the patient followed symptomatic management.  If the patient remains hypoxic, will potentially require admission.  Signout given to Dr. Deretha Emory at 1500.   Final Clinical Impression(s) / ED Diagnoses Final diagnoses:  Acute respiratory failure with hypoxia (HCC)  COPD exacerbation (HCC)  Upper respiratory tract infection, unspecified type    Rx / DC Orders ED Discharge Orders     None        Ernie Avena, MD 09/12/21 1505

## 2021-09-12 NOTE — ED Notes (Signed)
Patient transported to CT 

## 2021-09-12 NOTE — ED Notes (Signed)
Pt O2 level drops to below 90% with any activity or exertion. Dr. Deretha Emory aware.

## 2021-09-12 NOTE — ED Provider Notes (Addendum)
Patient had chest x-ray done at urgent care that showed bilateral infiltrates.  Patient was very short of breath there with oxygen saturations below 90%.  Started on oxygen and EMS gave her Solu-Medrol.  Patient was using her inhaler with chamber without much help.  EMS also did give a DuoNeb.  Patient still with some faint wheezing even now.  Patient's been feeling short of breath for 3 days.  Her COVID influenza is negative.  Labs without any significant abnormalities other than a lactic acid of 2.2.  Mild hypokalemia with a potassium of 3.2.  No leukocytosis.  Urinalysis negative.  Repeat a chest x-ray here to be did have one on file to look because she was at [facility]cus chart reviewed urgent care.  That showed mild bibasilar atelectasis/infiltrate we will going get CT chest to clarify this.  Patient's breathing overall better.  Currently off oxygen and maintaining oxygen sats around 93%.  We will keep her off oxygen and monitor.   Hold on antibiotics for right now pending the CT of the chest with contrast.   Vanetta Mulders, MD 09/12/21 1845   Patient's CT chest without evidence of pneumonia.  But patient was off oxygen but when she went to use the bathroom and came back she dropped her oxygen sats down below 90.  She gets very short of breath with any exertion.  Patient without any wheezing right now.  But I think this is an exacerbation of COPD.  Patient may very well at baseline needed home oxygen.  Will consult hospitalist for admission.   Vanetta Mulders, MD 09/12/21 2228

## 2021-09-12 NOTE — H&P (Signed)
History and Physical    PLEASE NOTE THAT DRAGON DICTATION SOFTWARE WAS USED IN THE CONSTRUCTION OF THIS NOTE.   Helen Vasquez NWG:[id number] DOB: 01-27-1956 DOA: 09/12/2021  PCP: System, Provider Not In (will further eval) Patient coming from: home   I have personally briefly reviewed patient's old medical records in [place]  Chief Complaint: Shortness of breath  HPI: Helen Vasquez is a 65 y.o. female with medical history significant for COPD who is admitted to [facility] on 09/12/2021 with acute COPD exacerbation after presenting from home to [facility] ED complaining of shortness of breath.   The patient reports 2 days of progressive shortness of breath associated with wheezing in the absence of any associated orthopnea, PND, or new onset peripheral edema. No recent chest pain, diaphoresis, palpitations, N/V, pre-syncope, or syncope.  He reports associated mild, nonproductive cough in the absence of any hemoptysis, new lower extremity erythema, or calf tenderness.  Denies any recent trauma or travel.  Denies any associated subjective fever, chills, rigors, or generalized myalgias.  No recent abdominal pain, diarrhea, or rash.  She also denies any recent dysuria, gross hematuria.  She confirms an underlying history of COPD, reports good compliance with her home Symbicort.  Denies any known baseline supplemental oxygen requirements.  Confirms that she is a former smoker.  In the setting of 2 days of progressive shortness of breath, the patient was brought via EMS to [facility] was for further evaluation management thereof, while receiving Solu-Medrol as well as duo nebulizer treatment in route to ED via EMS.    ED Course:  Vital signs in the ED were notable for the following: Afebrile; heart rate 101 15; blood pressure 139/77 -147/99; respiratory rate 18-21, oxygen saturation 85% on room air, which subsequently improved to the range of 95 to 97% on 2 L nasal cannula.  Labs were  notable for the following: CMP notable for potassium 3.2, bicarbonate 31, creatinine 0.75.  Lactic acid 2.2.  CBC notable for white cell count 7700.  COVID-19/influenza PCR found to be negative.  Imaging and additional notable ED work-up: EKG was sinus tachycardia with multiple PVCs, heart rate 116, normal intervals, nonspecific T wave inversion in aVL, and no evidence of ST changes, including no evidence of ST elevation.  CT chest with contrast showed no evidence of infiltrate, edema, effusion, or pneumothorax.  While in the ED, the following were administered: Duo nebulizer x1, potassium chloride 20 mEq p.o. x1.  Subsequent, the patient was admitted to the med telemetry unit for further evaluation management of suspected presenting acute COPD exacerbation.     Review of Systems: As per HPI otherwise 10 point review of systems negative.   Past Medical History:  Diagnosis Date   Asthma    COPD (chronic obstructive pulmonary disease) (HCC)    High blood pressure     History reviewed. No pertinent surgical history.  Social History:  reports that she has quit smoking. Her smoking use included cigarettes. She has never used smokeless tobacco. She reports that she does not drink alcohol and does not use drugs.   Allergies  Allergen Reactions   Ace Inhibitors Swelling    Reaction(s): Unknown   Penicillin G Hives    History reviewed. No pertinent family history.   Prior to Admission medications   Medication Sig Start Date End Date Taking? Authorizing Provider  budesonide-formoterol (SYMBICORT) 80-4.5 MCG/ACT inhaler Inhale 2 puffs into the lungs in the morning and at bedtime. 08/02/21   Raspet,  Derry Skill, PA-C     Objective    Physical Exam: Vitals:   09/12/21 2000 09/12/21 2121 09/12/21 2200 09/12/21 2230  BP: 125/88 (!) 155/89 (!) 152/97 (!) 151/89  Pulse: (!) 106 (!) 112 (!) 111 (!) 106  Resp: 19 20 (!) 23 (!) 23  Temp:      SpO2: 95% 97% 98% 95%  Weight:      Height:         General: appears to be stated age; alert, oriented; mildly increased work of breathing Skin: warm, dry, no rash Head:  AT/[place] Mouth:  Oral mucosa membranes appear moist, normal dentition Neck: supple; trachea midline Heart:  RRR; did not appreciate any M/R/G Lungs: CTAB, did not appreciate any wheezes, rales, or rhonchi Abdomen: + BS; soft, ND, NT Vascular: 2+ pedal pulses b/l; 2+ radial pulses b/l Extremities: no peripheral edema, no muscle wasting Neuro: strength and sensation intact in upper and lower extremities b/l     Labs on Admission: I have personally reviewed following labs and imaging studies  CBC: Recent Labs  Lab 09/12/21 1430  WBC 7.7  NEUTROABS 4.7  HGB 13.9  HCT 40.7  MCV 83.9  PLT 163   Basic Metabolic Panel: Recent Labs  Lab 09/12/21 1430  NA 142  K 3.2*  CL 103  CO2 31  GLUCOSE 122*  BUN 8  CREATININE 0.75  CALCIUM 9.3   GFR: Estimated Creatinine Clearance: 74.9 mL/min (by C-G formula based on SCr of 0.75 mg/dL). Liver Function Tests: Recent Labs  Lab 09/12/21 1430  AST 15  ALT 14  ALKPHOS 53  BILITOT 0.9  PROT 7.0  ALBUMIN 4.0   No results for input(s): LIPASE, AMYLASE in the last 168 hours. No results for input(s): AMMONIA in the last 168 hours. Coagulation Profile: No results for input(s): INR, PROTIME in the last 168 hours. Cardiac Enzymes: No results for input(s): CKTOTAL, CKMB, CKMBINDEX, TROPONINI in the last 168 hours. BNP (last 3 results) No results for input(s): PROBNP in the last 8760 hours. HbA1C: No results for input(s): HGBA1C in the last 72 hours. CBG: No results for input(s): GLUCAP in the last 168 hours. Lipid Profile: No results for input(s): CHOL, HDL, LDLCALC, TRIG, CHOLHDL, LDLDIRECT in the last 72 hours. Thyroid Function Tests: No results for input(s): TSH, T4TOTAL, FREET4, T3FREE, THYROIDAB in the last 72 hours. Anemia Panel: No results for input(s): VITAMINB12, FOLATE, FERRITIN, TIBC, IRON,  RETICCTPCT in the last 72 hours. Urine analysis:    Component Value Date/Time   COLORURINE COLORLESS (A) 09/12/2021 1430   APPEARANCEUR CLEAR 09/12/2021 1430   LABSPEC 1.004 (L) 09/12/2021 1430   PHURINE 8.0 09/12/2021 1430   GLUCOSEU NEGATIVE 09/12/2021 1430   HGBUR NEGATIVE 09/12/2021 1430   BILIRUBINUR NEGATIVE 09/12/2021 1430   KETONESUR NEGATIVE 09/12/2021 1430   PROTEINUR NEGATIVE 09/12/2021 1430   UROBILINOGEN 0.2 08/02/2021 1735   NITRITE NEGATIVE 09/12/2021 1430   LEUKOCYTESUR NEGATIVE 09/12/2021 1430    Radiological Exams on Admission: CT Chest W Contrast  Result Date: 09/12/2021 CLINICAL DATA:  Respiratory illness, questionable airspace disease on chest x-ray today. EXAM: CT CHEST WITH CONTRAST TECHNIQUE: Multidetector CT imaging of the chest was performed during intravenous contrast administration. CONTRAST:  12mL OMNIPAQUE IOHEXOL 350 MG/ML SOLN COMPARISON:  Portable chest today, PA and lateral chest 08/12/2021. FINDINGS: Cardiovascular: There is a prominent pulmonary trunk 3.5 cm indicating arterial hypertension. There is no central embolus. There is mild cardiomegaly with a right chamber predominance suggesting right heart dysfunction,  chronicity indeterminate. There are trace calcifications LAD coronary artery. No pericardial effusion is seen and no venous dilatation. There is lipomatous hypertrophy of the intra-atrial septum. The aortic root and ascending segment are both ectatic both measuring 3.8 cm caliber. There are minimal calcifications of the aortic arch. There is moderate tortuosity of the descending segment with no aneurysm or dissection , normal great vessel branching and opacification. Mediastinum/Nodes: No enlarged mediastinal, hilar, or axillary lymph nodes. Thyroid gland, trachea, and esophagus demonstrate no significant findings. Eventrations noted along both hemidiaphragms. Lungs/Pleura: There are atelectatic bands in the lower lobes. There are few linear  scar-like opacities in both lungs in the mid to lower lung fields. No nodule or infiltrate. There is a calcified granuloma anteriorly in the left upper lobe. The central airways are clear. There are mild centrilobular emphysematous changes lung apices. No pleural effusion or pneumothorax. Upper Abdomen: No acute abnormality. There is mild adenomatous hyperplasia of both adrenal glands. Musculoskeletal: There are multilevel thoracic spine collapsed discs, ankylosis across multiple upper to midthoracic spine vertebral bodies, and extensive bridging syndesmophytes. Osteopenia. IMPRESSION: 1. No focal pneumonia is evident. COPD with scattered linear scar-like opacities and lower lobe atelectatic bands. 2. Cardiomegaly with the right chamber predominance indicating right heart dysfunction, with prominent pulmonary trunk indicating arterial hypertension. No central embolus is seen. 3. Aortic and coronary artery atherosclerosis with descending aortic tortuosity, root and ascending segment aortic ectasia, and lipomatous hypertrophy of the interatrial septum. 4. Osteopenia with thoracic spine degenerative changes, bridging syndesmophytes and multilevel vertebral body ankylosis. Electronically Signed   By: Telford Nab M.D.   On: 09/12/2021 22:03   DG Chest Port 1 View  Result Date: 09/12/2021 CLINICAL DATA:  Short of breath and hypoxemia. EXAM: PORTABLE CHEST 1 VIEW COMPARISON:  08/12/2021 FINDINGS: Mild bibasilar airspace disease has developed since the prior study. Heart size and vascularity normal. Lungs are clear. IMPRESSION: Mild bibasilar atelectasis/infiltrate. Electronically Signed   By: Franchot Gallo M.D.   On: 09/12/2021 18:28     EKG: Independently reviewed, with result as described above.    Assessment/Plan    Principal Problem:   Acute exacerbation of chronic obstructive pulmonary disease (COPD) (HCC) Active Problems:   SOB (shortness of breath)   Lactic acidosis   Hypokalemia    #)  Acute COPD exacerbation: in the context of a documented history of COPD, diagnosis of acute exacerbation on the basis of 2 days of progressive shortness of breath associated with increased work of breathing, wheezing, new onset nonproductive cough, with presenting CT chest with contrast showing no evidence of acute cardiopulmonary process, including no evidence of infiltrate, edema, effusion, or pneumothorax. Etiology of exac currently unclear We will noting COVID-19/influenza negative in the ED.  no clinical or radiographic evidence to suggest acutely decompensated heart failure at this time. Additionally, ACS appears less likely in the absence of chest pain, with EKG showing no evidence of acute ischemic changes. Clinically, acute PE also appears to be less likely at this time. COVID-19/influenza PCR were checked today and found to be negative.  Outpatient respiratory regimen appears to include scheduled Symbicort, with the patient reporting good associated compliance and acknowledging that she is former smoker.  Presentation also associated with acute hypoxic respiratory distress, initial O2 sats in the mid 80s on room air, subsequently going into the mid 90s on 3 L nasal cannula, and context of no known baseline supplemental oxygen requirements.     Plan: monitor continuous pulse oxymetry. Monitor on telemetry. Solumedrol. Scheduled  duonebs q6 hours. Prn albuterol inhaler. BMP in the morning. Repeat CBC in the morning. Check serum Mg and Phos levels. Will attempt additional chart review to evaluate most recent PFT results. Will start azithromycin for benefit of shortened duration of hospitalization associated with antibiotic initiation in the setting of acute COPD exacerbation. Check blood gas.  Incentive spirometry.       #) Hypokalemia: Presenting serum potassium noted to be 3.2.  Status post 20 equivalents oral potassium in the ED today.  Plan: Add on magnesium level.  Monitor on telemetry repeat  BMP in the morning.       #) Lactic acidosis: Mildly elevated initial lactate 2.2, likely in the basis of mild diffuse relative tissue hypoperfusion as a consequence of diminished oxygen delivery capacity in the setting of presenting acute hypoxic respiratory distress due to acute COPD exacerbation, as above.  No evidence of underlying infectious process at this time, and criteria not currently met for sepsis.  Provide gentle IV fluids, while tending to suspected underlying acute hypoxic respiratory distress/acute COPD exacerbation, and repeat lactate level in the morning while monitoring interval renal function.  Plan: Repeat lactate the morning.  Gentle IV fluids in the form of LR at 70 cc/h x 6 hours.  Monitor strict I's and O's Daily weights.  Repeat CMP in the morning.  Further evaluation management of AKI history of stress the setting of acute COPD exacerbation, as above.  Monitor continuous pulse oximetry.     DVT prophylaxis: SCD's   Code Status: Full code Family Communication: none Disposition Plan: Per Rounding Team Consults called: none;  Admission status: obs; med telemetry   PLEASE NOTE THAT DRAGON DICTATION SOFTWARE WAS USED IN THE CONSTRUCTION OF THIS NOTE.   Arlee DO [facility] [place]   09/12/2021, 10:52 PM

## 2021-09-13 ENCOUNTER — Encounter (HOSPITAL_COMMUNITY): Payer: Self-pay | Admitting: Internal Medicine

## 2021-09-13 DIAGNOSIS — J441 Chronic obstructive pulmonary disease with (acute) exacerbation: Secondary | ICD-10-CM | POA: Diagnosis not present

## 2021-09-13 DIAGNOSIS — E872 Acidosis, unspecified: Secondary | ICD-10-CM | POA: Diagnosis present

## 2021-09-13 DIAGNOSIS — E876 Hypokalemia: Secondary | ICD-10-CM | POA: Diagnosis present

## 2021-09-13 LAB — COMPREHENSIVE METABOLIC PANEL
ALT: 15 U/L (ref 0–44)
AST: 15 U/L (ref 15–41)
Albumin: 4 g/dL (ref 3.5–5.0)
Alkaline Phosphatase: 56 U/L (ref 38–126)
Anion gap: 10 (ref 5–15)
BUN: 17 mg/dL (ref 8–23)
CO2: 25 mmol/L (ref 22–32)
Calcium: 9.7 mg/dL (ref 8.9–10.3)
Chloride: 103 mmol/L (ref 98–111)
Creatinine, Ser: 0.7 mg/dL (ref 0.44–1.00)
GFR, Estimated: >60 mL/min (ref >60)
Glucose, Bld: 154 mg/dL — ABNORMAL HIGH (ref 70–99)
Potassium: 4.5 mmol/L (ref 3.5–5.1)
Sodium: 138 mmol/L (ref 135–145)
Total Bilirubin: 0.7 mg/dL (ref 0.3–1.2)
Total Protein: 7.4 g/dL (ref 6.5–8.1)

## 2021-09-13 LAB — BLOOD GAS, VENOUS
Acid-Base Excess: 0.4 mmol/L (ref 0.0–2.0)
Bicarbonate: 25 mmol/L (ref 20.0–28.0)
O2 Saturation: 88.7 %
Patient temperature: 98.6
pCO2, Ven: 42.6 mmHg — ABNORMAL LOW (ref 44.0–60.0)
pH, Ven: 7.387 (ref 7.250–7.430)
pO2, Ven: 56.5 mmHg — ABNORMAL HIGH (ref 32.0–45.0)

## 2021-09-13 LAB — CBC WITH DIFFERENTIAL/PLATELET
Abs Immature Granulocytes: 0.04 10*3/uL (ref 0.00–0.07)
Basophils Absolute: 0 10*3/uL (ref 0.0–0.1)
Basophils Relative: 0 %
Eosinophils Absolute: 0 10*3/uL (ref 0.0–0.5)
Eosinophils Relative: 0 %
HCT: 40.1 % (ref 36.0–46.0)
Hemoglobin: 14.4 g/dL (ref 12.0–15.0)
Immature Granulocytes: 0 %
Lymphocytes Relative: 11 %
Lymphs Abs: 1.1 10*3/uL (ref 0.7–4.0)
MCH: 29.8 pg (ref 26.0–34.0)
MCHC: 35.9 g/dL (ref 30.0–36.0)
MCV: 82.9 fL (ref 80.0–100.0)
Monocytes Absolute: 0.3 10*3/uL (ref 0.1–1.0)
Monocytes Relative: 3 %
Neutro Abs: 8.3 10*3/uL — ABNORMAL HIGH (ref 1.7–7.7)
Neutrophils Relative %: 86 %
Platelets: 271 10*3/uL (ref 150–400)
RBC: 4.84 MIL/uL (ref 3.87–5.11)
RDW: 14.8 % (ref 11.5–15.5)
WBC: 9.7 10*3/uL (ref 4.0–10.5)
nRBC: 0 % (ref 0.0–0.2)

## 2021-09-13 LAB — MAGNESIUM
Magnesium: 2.3 mg/dL (ref 1.7–2.4)
Magnesium: 2.1 mg/dL (ref 1.7–2.4)

## 2021-09-13 LAB — LACTIC ACID, PLASMA: Lactic Acid, Venous: 1.3 mmol/L (ref 0.5–1.9)

## 2021-09-13 LAB — HIV ANTIBODY (ROUTINE TESTING W REFLEX): HIV Screen 4th Generation wRfx: NONREACTIVE

## 2021-09-13 LAB — PHOSPHORUS: Phosphorus: 3 mg/dL (ref 2.5–4.6)

## 2021-09-13 MED ORDER — AZITHROMYCIN 250 MG PO TABS: 250.0000 mg | ORAL_TABLET | Freq: Every day | ORAL | 0 refills | Status: AC

## 2021-09-13 MED ORDER — ALBUTEROL SULFATE HFA 108 (90 BASE) MCG/ACT IN AERS: 2.0000 | INHALATION_SPRAY | Freq: Four times a day (QID) | RESPIRATORY_TRACT | 2 refills | Status: DC | PRN

## 2021-09-13 MED ORDER — ALBUTEROL SULFATE (2.5 MG/3ML) 0.083% IN NEBU: 2.5000 mg | INHALATION_SOLUTION | RESPIRATORY_TRACT | 12 refills | Status: DC | PRN

## 2021-09-13 MED ORDER — SODIUM CHLORIDE 0.9 % IV SOLN
500.0000 mg | INTRAVENOUS | Status: DC
  Administered 2021-09-13: 07:00:00 500 mg via INTRAVENOUS
  Filled 2021-09-13: qty 5

## 2021-09-13 MED ORDER — PREDNISONE 20 MG PO TABS: 40.0000 mg | ORAL_TABLET | Freq: Every day | ORAL | 0 refills | Status: DC

## 2021-09-13 MED ORDER — LACTATED RINGERS IV SOLN: INTRAVENOUS | Status: AC

## 2021-09-13 MED ORDER — ALBUTEROL SULFATE (2.5 MG/3ML) 0.083% IN NEBU
2.5000 mg | INHALATION_SOLUTION | Freq: Four times a day (QID) | RESPIRATORY_TRACT | Status: DC | PRN
  Administered 2021-09-13: 14:00:00 2.5 mg via RESPIRATORY_TRACT
  Filled 2021-09-13: qty 3

## 2021-09-13 NOTE — Discharge Summary (Signed)
Physician Discharge Summary  Helen Vasquez ZOX:[id number] DOB: [birth year] DOA: 09/12/2021  PCP: [facility]  Admit date: 09/12/2021 Discharge date: 09/13/2021  Admitted From: home alone  Disposition:  same   Recommendations for Outpatient Follow-up:  Recommend pulm referral and PFTs  Home Health:  none  Discharge Condition:  stable   CODE STATUS:  Full code   Diet recommendation:  heart healthy Consultations: none  Procedures/Studies: none   Discharge Diagnoses:  Principal Problem:   Acute exacerbation of chronic obstructive pulmonary disease (COPD) (HCC) Active Problems:   Lactic acidosis   Hypokalemia     Brief Summary: Helen Vasquez is a 65 y.o. female with medical history significant for COPD who quit smoking about 2 yrs ago and is compliant with her meds who is admitted to [facility] on 09/12/2021 with acute COPD exacerbation after presenting from home to [facility] ED complaining of shortness of breath.      In ED> oxygen saturation 85% on room air, which subsequently improved to the range of 95 to 97% on 2 L nasal cannula. Lactic acid 2.2 K 3.2  Hospital Course:  COPD Exacerbation - she had an exacerbation in Nov and in July -has had more frequent flare up in the past year although she quit smoking- ? If allergies - she took care of her grandson for 1 wk who had a cold- she developed her symptoms the day he went home - hypoxia improved with IV steroids and Nebs - she does not have a rescue inhaler - will dc home with Prednisone, Albuterol MDI,  and a Neb machine - have advised her to wear a mask whenever she is around people especially if they are having respiratory symptoms - she has no pulmonologist- based on the frequency of her recurrences, I would recommend she have a pulmonologist   Lactic acidosis - due to resp distress - improved from 202 to 1.3  Discharge Exam: Vitals:   09/13/21 1100 09/13/21 1137  BP: (!) 134/91 (!) 153/87  Pulse:  (!) 107 100  Resp: 20 (!) 22  Temp:    SpO2: 92% 97%   Vitals:   09/13/21 0900 09/13/21 0917 09/13/21 1100 09/13/21 1137  BP: (!) 147/94  (!) 134/91 (!) 153/87  Pulse: (!) 53  (!) 107 100  Resp: 19  20 (!) 22  Temp:      SpO2: 91% 99% 92% 97%  Weight:      Height:        General: Pt is alert, awake, not in acute distress Cardiovascular: RRR, S1/S2 +, no rubs, no gallops Respiratory: CTA bilaterally, no wheezing, no rhonchi Abdominal: Soft, NT, ND, bowel sounds + Extremities: no edema, no cyanosis   Discharge Instructions  Discharge Instructions     Diet - low sodium heart healthy   Complete by: As directed    Increase activity slowly   Complete by: As directed       Allergies as of 09/13/2021       Reactions   Ace Inhibitors Swelling   Reaction(s): Unknown   Penicillin G Hives        Medication List     TAKE these medications    albuterol (2.5 MG/3ML) 0.083% nebulizer solution Commonly known as: PROVENTIL Take 3 mLs (2.5 mg total) by nebulization every 4 (four) hours as needed for wheezing or shortness of breath.   albuterol 108 (90 Base) MCG/ACT inhaler Commonly known as: VENTOLIN HFA Inhale 2 puffs into the lungs every 6 (  six) hours as needed for wheezing or shortness of breath.   azithromycin 250 MG tablet Commonly known as: Zithromax Take 1 tablet (250 mg total) by mouth daily for 4 days.   budesonide-formoterol 80-4.5 MCG/ACT inhaler Commonly known as: Symbicort Inhale 2 puffs into the lungs in the morning and at bedtime.   predniSONE 20 MG tablet Commonly known as: DELTASONE Take 2 tablets (40 mg total) by mouth daily.               Durable Medical Equipment  (From admission, onward)           Start     Ordered   09/13/21 1319  For home use only DME Nebulizer machine  Once       Question Answer Comment  Patient needs a nebulizer to treat with the following condition COPD (chronic obstructive pulmonary disease) with acute  bronchitis (HCC)   Length of Need 6 Months      09/13/21 1319            Allergies  Allergen Reactions   Ace Inhibitors Swelling    Reaction(s): Unknown   Penicillin G Hives      CT Chest W Contrast  Result Date: 09/12/2021 CLINICAL DATA:  Respiratory illness, questionable airspace disease on chest x-ray today. EXAM: CT CHEST WITH CONTRAST TECHNIQUE: Multidetector CT imaging of the chest was performed during intravenous contrast administration. CONTRAST:  80mL OMNIPAQUE IOHEXOL 350 MG/ML SOLN COMPARISON:  Portable chest today, PA and lateral chest 08/12/2021. FINDINGS: Cardiovascular: There is a prominent pulmonary trunk 3.5 cm indicating arterial hypertension. There is no central embolus. There is mild cardiomegaly with a right chamber predominance suggesting right heart dysfunction, chronicity indeterminate. There are trace calcifications LAD coronary artery. No pericardial effusion is seen and no venous dilatation. There is lipomatous hypertrophy of the intra-atrial septum. The aortic root and ascending segment are both ectatic both measuring 3.8 cm caliber. There are minimal calcifications of the aortic arch. There is moderate tortuosity of the descending segment with no aneurysm or dissection , normal great vessel branching and opacification. Mediastinum/Nodes: No enlarged mediastinal, hilar, or axillary lymph nodes. Thyroid gland, trachea, and esophagus demonstrate no significant findings. Eventrations noted along both hemidiaphragms. Lungs/Pleura: There are atelectatic bands in the lower lobes. There are few linear scar-like opacities in both lungs in the mid to lower lung fields. No nodule or infiltrate. There is a calcified granuloma anteriorly in the left upper lobe. The central airways are clear. There are mild centrilobular emphysematous changes lung apices. No pleural effusion or pneumothorax. Upper Abdomen: No acute abnormality. There is mild adenomatous hyperplasia of both  adrenal glands. Musculoskeletal: There are multilevel thoracic spine collapsed discs, ankylosis across multiple upper to midthoracic spine vertebral bodies, and extensive bridging syndesmophytes. Osteopenia. IMPRESSION: 1. No focal pneumonia is evident. COPD with scattered linear scar-like opacities and lower lobe atelectatic bands. 2. Cardiomegaly with the right chamber predominance indicating right heart dysfunction, with prominent pulmonary trunk indicating arterial hypertension. No central embolus is seen. 3. Aortic and coronary artery atherosclerosis with descending aortic tortuosity, root and ascending segment aortic ectasia, and lipomatous hypertrophy of the interatrial septum. 4. Osteopenia with thoracic spine degenerative changes, bridging syndesmophytes and multilevel vertebral body ankylosis. Electronically Signed   By: Almira Bar M.D.   On: 09/12/2021 22:03   DG Chest Port 1 View  Result Date: 09/12/2021 CLINICAL DATA:  Short of breath and hypoxemia. EXAM: PORTABLE CHEST 1 VIEW COMPARISON:  08/12/2021 FINDINGS: Mild bibasilar airspace disease  has developed since the prior study. Heart size and vascularity normal. Lungs are clear. IMPRESSION: Mild bibasilar atelectasis/infiltrate. Electronically Signed   By: Marlan Palau M.D.   On: 09/12/2021 18:28     The results of significant diagnostics from this hospitalization (including imaging, microbiology, ancillary and laboratory) are listed below for reference.     Microbiology: Recent Results (from the past 240 hour(s))  Resp Panel by RT-PCR (Flu A&B, Covid) Nasopharyngeal Swab     Status: None   Collection Time: 09/12/21  2:34 PM   Specimen: Nasopharyngeal Swab; Nasopharyngeal(NP) swabs in vial transport medium  Result Value Ref Range Status   SARS Coronavirus 2 by RT PCR NEGATIVE NEGATIVE Final    Comment: (NOTE) SARS-CoV-2 target nucleic acids are NOT DETECTED.  The SARS-CoV-2 RNA is generally detectable in upper  respiratory specimens during the acute phase of infection. The lowest concentration of SARS-CoV-2 viral copies this assay can detect is 138 copies/mL. A negative result does not preclude SARS-Cov-2 infection and should not be used as the sole basis for treatment or other patient management decisions. A negative result may occur with  improper specimen collection/handling, submission of specimen other than nasopharyngeal swab, presence of viral mutation(s) within the areas targeted by this assay, and inadequate number of viral copies(<138 copies/mL). A negative result must be combined with clinical observations, patient history, and epidemiological information. The expected result is Negative.  Fact Sheet for Patients:  BloggerCourse.com  Fact Sheet for Healthcare Providers:  SeriousBroker.it  This test is no t yet approved or cleared by the [place] FDA and  has been authorized for detection and/or diagnosis of SARS-CoV-2 by FDA under an Emergency Use Authorization (EUA). This EUA will remain  in effect (meaning this test can be used) for the duration of the COVID-19 declaration under Section 564(b)(1) of the Act, 21 U.S.C.section 360bbb-3(b)(1), unless the authorization is terminated  or revoked sooner.       Influenza A by PCR NEGATIVE NEGATIVE Final   Influenza B by PCR NEGATIVE NEGATIVE Final    Comment: (NOTE) The Xpert Xpress SARS-CoV-2/FLU/RSV plus assay is intended as an aid in the diagnosis of influenza from Nasopharyngeal swab specimens and should not be used as a sole basis for treatment. Nasal washings and aspirates are unacceptable for Xpert Xpress SARS-CoV-2/FLU/RSV testing.  Fact Sheet for Patients: BloggerCourse.com  Fact Sheet for Healthcare Providers: SeriousBroker.it  This test is not yet approved or cleared by the [place] FDA and has been  authorized for detection and/or diagnosis of SARS-CoV-2 by FDA under an Emergency Use Authorization (EUA). This EUA will remain in effect (meaning this test can be used) for the duration of the COVID-19 declaration under Section 564(b)(1) of the Act, 21 U.S.C. section 360bbb-3(b)(1), unless the authorization is terminated or revoked.  Performed at [facility], [address] [place], [place] [address]      Labs: BNP (last 3 results) Recent Labs    08/12/21 1704  BNP 66.7   Basic Metabolic Panel: Recent Labs  Lab 09/12/21 1430 09/12/21 1540 09/13/21 0430  NA 142  --  138  K 3.2*  --  4.5  CL 103  --  103  CO2 31  --  25  GLUCOSE 122*  --  154*  BUN 8  --  17  CREATININE 0.75  --  0.70  CALCIUM 9.3  --  9.7  MG  --  2.3 2.1  PHOS  --   --  3.0  Liver Function Tests: Recent Labs  Lab 09/12/21 1430 09/13/21 0430  AST 15 15  ALT 14 15  ALKPHOS 53 56  BILITOT 0.9 0.7  PROT 7.0 7.4  ALBUMIN 4.0 4.0   No results for input(s): LIPASE, AMYLASE in the last 168 hours. No results for input(s): AMMONIA in the last 168 hours. CBC: Recent Labs  Lab 09/12/21 1430 09/13/21 0430  WBC 7.7 9.7  NEUTROABS 4.7 8.3*  HGB 13.9 14.4  HCT 40.7 40.1  MCV 83.9 82.9  PLT 273 271   Cardiac Enzymes: No results for input(s): CKTOTAL, CKMB, CKMBINDEX, TROPONINI in the last 168 hours. BNP: Invalid input(s): POCBNP CBG: No results for input(s): GLUCAP in the last 168 hours. D-Dimer No results for input(s): DDIMER in the last 72 hours. Hgb A1c No results for input(s): HGBA1C in the last 72 hours. Lipid Profile No results for input(s): CHOL, HDL, LDLCALC, TRIG, CHOLHDL, LDLDIRECT in the last 72 hours. Thyroid function studies No results for input(s): TSH, T4TOTAL, T3FREE, THYROIDAB in the last 72 hours.  Invalid input(s): FREET3 Anemia work up No results for input(s): VITAMINB12, FOLATE, FERRITIN, TIBC, IRON, RETICCTPCT in the last 72  hours. Urinalysis    Component Value Date/Time   COLORURINE COLORLESS (A) 09/12/2021 1430   APPEARANCEUR CLEAR 09/12/2021 1430   LABSPEC 1.004 (L) 09/12/2021 1430   PHURINE 8.0 09/12/2021 1430   GLUCOSEU NEGATIVE 09/12/2021 1430   HGBUR NEGATIVE 09/12/2021 1430   BILIRUBINUR NEGATIVE 09/12/2021 1430   KETONESUR NEGATIVE 09/12/2021 1430   PROTEINUR NEGATIVE 09/12/2021 1430   UROBILINOGEN 0.2 08/02/2021 1735   NITRITE NEGATIVE 09/12/2021 1430   LEUKOCYTESUR NEGATIVE 09/12/2021 1430   Sepsis Labs Invalid input(s): PROCALCITONIN,  WBC,  LACTICIDVEN Microbiology Recent Results (from the past 240 hour(s))  Resp Panel by RT-PCR (Flu A&B, Covid) Nasopharyngeal Swab     Status: None   Collection Time: 09/12/21  2:34 PM   Specimen: Nasopharyngeal Swab; Nasopharyngeal(NP) swabs in vial transport medium  Result Value Ref Range Status   SARS Coronavirus 2 by RT PCR NEGATIVE NEGATIVE Final    Comment: (NOTE) SARS-CoV-2 target nucleic acids are NOT DETECTED.  The SARS-CoV-2 RNA is generally detectable in upper respiratory specimens during the acute phase of infection. The lowest concentration of SARS-CoV-2 viral copies this assay can detect is 138 copies/mL. A negative result does not preclude SARS-Cov-2 infection and should not be used as the sole basis for treatment or other patient management decisions. A negative result may occur with  improper specimen collection/handling, submission of specimen other than nasopharyngeal swab, presence of viral mutation(s) within the areas targeted by this assay, and inadequate number of viral copies(<138 copies/mL). A negative result must be combined with clinical observations, patient history, and epidemiological information. The expected result is Negative.  Fact Sheet for Patients:  BloggerCourse.com  Fact Sheet for Healthcare Providers:  SeriousBroker.it  This test is no t yet approved or  cleared by the [place] FDA and  has been authorized for detection and/or diagnosis of SARS-CoV-2 by FDA under an Emergency Use Authorization (EUA). This EUA will remain  in effect (meaning this test can be used) for the duration of the COVID-19 declaration under Section 564(b)(1) of the Act, 21 U.S.C.section 360bbb-3(b)(1), unless the authorization is terminated  or revoked sooner.       Influenza A by PCR NEGATIVE NEGATIVE Final   Influenza B by PCR NEGATIVE NEGATIVE Final    Comment: (NOTE) The Xpert Xpress SARS-CoV-2/FLU/RSV plus assay is intended as an  aid in the diagnosis of influenza from Nasopharyngeal swab specimens and should not be used as a sole basis for treatment. Nasal washings and aspirates are unacceptable for Xpert Xpress SARS-CoV-2/FLU/RSV testing.  Fact Sheet for Patients: BloggerCourse.com  Fact Sheet for Healthcare Providers: SeriousBroker.it  This test is not yet approved or cleared by the [place] FDA and has been authorized for detection and/or diagnosis of SARS-CoV-2 by FDA under an Emergency Use Authorization (EUA). This EUA will remain in effect (meaning this test can be used) for the duration of the COVID-19 declaration under Section 564(b)(1) of the Act, 21 U.S.C. section 360bbb-3(b)(1), unless the authorization is terminated or revoked.  Performed at [facility], [address] [place], [place] [address]      Time coordinating discharge in minutes: 65  SIGNED:   Calvert Cantor, MD  [facility] 09/13/2021, 1:24 PM

## 2021-09-13 NOTE — Discharge Planning (Signed)
...  Transition of Care [facility]) - Emergency Department Mini Assessment   Patient Details  Name: Helen Vasquez MRN: [id number] Date of Birth: 25-Sep-1955  [facility]) CM/SW Contact:    Lavenia Atlas, RN Phone Number: 09/13/2021, 4:09 PM   Clinical Narrative:  Patient admitted for acute COPD excerbation.   ED Mini Assessment:   RNCM received message from RN Destiny L. asking if nebulizer would be given to patient or if she can pick it up. This RNCM did not receive TOC consult.  RNCM spoke with patient ( daughter) at bedside who reports having a nebulizer previously however it no longer works.  RNCM will coordinate with [facility] transfer [facility].   Patient Contact and Communications   RNCM will follow up with patient.    Admission diagnosis:  Acute exacerbation of chronic obstructive pulmonary disease (COPD) (HCC) [J44.1] Patient Active Problem List   Diagnosis Date Noted   Lactic acidosis 09/13/2021   Hypokalemia 09/13/2021   Acute exacerbation of chronic obstructive pulmonary disease (COPD) (HCC) 09/12/2021   PCP:  [facility] Pharmacy:   [id number] - [place], [place] - [address] AT [facility] [address] [place] [address] Phone: [phone] Fax: [phone]

## 2021-09-13 NOTE — Progress Notes (Signed)
RNCM called patient to advise of status of nebulizer. Patient unavailable request a call back.

## 2021-09-13 NOTE — Discharge Planning (Signed)
RNCM called [facility] transfer coordinator, who is out of office until 09/17/2021. RNCM spoke with Bonita Quin and [place] [facility] who advised the patient will need to make an appointment with her PCP clinic because they are the only providers that house the nebulizers. RNCM will continue to research to find a DME company that takes [employer].

## 2021-09-13 NOTE — Discharge Planning (Signed)
RNCM contacted DME suppliers and awaiting response ( [employer], [employer]  [employer] is unable to provide nebulizer for [facility] patient

## 2021-09-16 ENCOUNTER — Telehealth (HOSPITAL_COMMUNITY): Payer: Self-pay

## 2021-09-16 NOTE — Telephone Encounter (Signed)
RNCM spoke with patient to advise after speaking RNCM spoke with patient to advise after speaking with the [facility] respiratory representative on Friday 10/14/20, they advise to contact [facility] primary clinic to get her nebulizer. [facility] clinic has the nebulizer on hand. Patient verbalized understanding. No additional needs. the [facility] respiratory representative on Friday 10/14/20, they advise to contact [facility] primary clinic to get her nebulizer. [facility] clinic has the nebulizer on hand. Patient verbalized understanding. No additional needs.

## 2021-10-01 ENCOUNTER — Emergency Department (HOSPITAL_COMMUNITY): Payer: No Typology Code available for payment source

## 2021-10-01 ENCOUNTER — Observation Stay (HOSPITAL_COMMUNITY)
Admission: EM | Admit: 2021-10-01 | Discharge: 2021-10-03 | Disposition: A | Discharge status: Home / Self Care | Payer: No Typology Code available for payment source | Attending: Internal Medicine | Admitting: Internal Medicine

## 2021-10-01 ENCOUNTER — Encounter (HOSPITAL_COMMUNITY): Payer: Self-pay | Admitting: Internal Medicine

## 2021-10-01 DIAGNOSIS — J45909 Unspecified asthma, uncomplicated: Secondary | ICD-10-CM | POA: Diagnosis not present

## 2021-10-01 DIAGNOSIS — Z20822 Contact with and (suspected) exposure to covid-19: Secondary | ICD-10-CM | POA: Diagnosis not present

## 2021-10-01 DIAGNOSIS — Z87891 Personal history of nicotine dependence: Secondary | ICD-10-CM | POA: Insufficient documentation

## 2021-10-01 DIAGNOSIS — Z79899 Other long term (current) drug therapy: Secondary | ICD-10-CM | POA: Insufficient documentation

## 2021-10-01 DIAGNOSIS — I10 Essential (primary) hypertension: Secondary | ICD-10-CM | POA: Insufficient documentation

## 2021-10-01 DIAGNOSIS — R140 Abdominal distension (gaseous): Secondary | ICD-10-CM | POA: Insufficient documentation

## 2021-10-01 DIAGNOSIS — J441 Chronic obstructive pulmonary disease with (acute) exacerbation: Secondary | ICD-10-CM | POA: Insufficient documentation

## 2021-10-01 DIAGNOSIS — Z7982 Long term (current) use of aspirin: Secondary | ICD-10-CM | POA: Diagnosis not present

## 2021-10-01 DIAGNOSIS — E876 Hypokalemia: Secondary | ICD-10-CM | POA: Diagnosis not present

## 2021-10-01 DIAGNOSIS — J9601 Acute respiratory failure with hypoxia: Secondary | ICD-10-CM | POA: Diagnosis not present

## 2021-10-01 DIAGNOSIS — R0602 Shortness of breath: Secondary | ICD-10-CM | POA: Diagnosis present

## 2021-10-01 LAB — CBC WITH DIFFERENTIAL/PLATELET
Abs Immature Granulocytes: 0.03 10*3/uL (ref 0.00–0.07)
Basophils Absolute: 0 10*3/uL (ref 0.0–0.1)
Basophils Relative: 0 %
Eosinophils Absolute: 0.6 10*3/uL — ABNORMAL HIGH (ref 0.0–0.5)
Eosinophils Relative: 7 %
HCT: 40.4 % (ref 36.0–46.0)
Hemoglobin: 14.1 g/dL (ref 12.0–15.0)
Immature Granulocytes: 0 %
Lymphocytes Relative: 26 %
Lymphs Abs: 2.1 10*3/uL (ref 0.7–4.0)
MCH: 29.1 pg (ref 26.0–34.0)
MCHC: 34.9 g/dL (ref 30.0–36.0)
MCV: 83.5 fL (ref 80.0–100.0)
Monocytes Absolute: 0.6 10*3/uL (ref 0.1–1.0)
Monocytes Relative: 8 %
Neutro Abs: 4.7 10*3/uL (ref 1.7–7.7)
Neutrophils Relative %: 59 %
Platelets: 282 10*3/uL (ref 150–400)
RBC: 4.84 MIL/uL (ref 3.87–5.11)
RDW: 14.7 % (ref 11.5–15.5)
WBC: 8.1 10*3/uL (ref 4.0–10.5)
nRBC: 0 % (ref 0.0–0.2)

## 2021-10-01 LAB — COMPREHENSIVE METABOLIC PANEL
ALT: 17 U/L (ref 0–44)
AST: 16 U/L (ref 15–41)
Albumin: 3.9 g/dL (ref 3.5–5.0)
Alkaline Phosphatase: 54 U/L (ref 38–126)
Anion gap: 7 (ref 5–15)
BUN: 10 mg/dL (ref 8–23)
CO2: 28 mmol/L (ref 22–32)
Calcium: 9.4 mg/dL (ref 8.9–10.3)
Chloride: 104 mmol/L (ref 98–111)
Creatinine, Ser: 0.79 mg/dL (ref 0.44–1.00)
GFR, Estimated: >60 mL/min (ref >60)
Glucose, Bld: 117 mg/dL — ABNORMAL HIGH (ref 70–99)
Potassium: 3.3 mmol/L — ABNORMAL LOW (ref 3.5–5.1)
Sodium: 139 mmol/L (ref 135–145)
Total Bilirubin: 1.2 mg/dL (ref 0.3–1.2)
Total Protein: 7.1 g/dL (ref 6.5–8.1)

## 2021-10-01 LAB — PHOSPHORUS: Phosphorus: 2.7 mg/dL (ref 2.5–4.6)

## 2021-10-01 LAB — MAGNESIUM: Magnesium: 2.1 mg/dL (ref 1.7–2.4)

## 2021-10-01 LAB — TROPONIN I (HIGH SENSITIVITY)
Troponin I (High Sensitivity): 11 ng/L (ref <18)
Troponin I (High Sensitivity): 11 ng/L (ref <18)

## 2021-10-01 LAB — RESP PANEL BY RT-PCR (FLU A&B, COVID) ARPGX2
Influenza A by PCR: NEGATIVE
Influenza B by PCR: NEGATIVE
SARS Coronavirus 2 by RT PCR: NEGATIVE

## 2021-10-01 MED ORDER — ONDANSETRON HCL 4 MG/2ML IJ SOLN: 4.0000 mg | Freq: Four times a day (QID) | INTRAMUSCULAR | Status: DC | PRN

## 2021-10-01 MED ORDER — POTASSIUM CHLORIDE CRYS ER 20 MEQ PO TBCR
40.0000 meq | EXTENDED_RELEASE_TABLET | Freq: Once | ORAL | Status: AC
  Administered 2021-10-01: 40 meq via ORAL
  Filled 2021-10-01: qty 2

## 2021-10-01 MED ORDER — ENOXAPARIN SODIUM 40 MG/0.4ML IJ SOSY
40.0000 mg | PREFILLED_SYRINGE | INTRAMUSCULAR | Status: DC
  Administered 2021-10-01 – 2021-10-02 (×2): 40 mg via SUBCUTANEOUS
  Filled 2021-10-01 (×2): qty 0.4

## 2021-10-01 MED ORDER — METHYLPREDNISOLONE SODIUM SUCC 40 MG IJ SOLR
40.0000 mg | Freq: Two times a day (BID) | INTRAMUSCULAR | Status: AC
  Administered 2021-10-01: 40 mg via INTRAVENOUS
  Filled 2021-10-01: qty 1

## 2021-10-01 MED ORDER — MAGNESIUM SULFATE 2 GM/50ML IV SOLN
2.0000 g | Freq: Once | INTRAVENOUS | Status: AC
  Administered 2021-10-01: 2 g via INTRAVENOUS
  Filled 2021-10-01: qty 50

## 2021-10-01 MED ORDER — LORATADINE 10 MG PO TABS
10.0000 mg | ORAL_TABLET | Freq: Every day | ORAL | Status: DC
  Administered 2021-10-01 – 2021-10-03 (×3): 10 mg via ORAL
  Filled 2021-10-01 (×3): qty 1

## 2021-10-01 MED ORDER — IPRATROPIUM BROMIDE 0.02 % IN SOLN: 0.5000 mg | Freq: Four times a day (QID) | RESPIRATORY_TRACT | Status: DC

## 2021-10-01 MED ORDER — IPRATROPIUM-ALBUTEROL 0.5-2.5 (3) MG/3ML IN SOLN
3.0000 mL | Freq: Once | RESPIRATORY_TRACT | Status: AC
  Administered 2021-10-01: 3 mL via RESPIRATORY_TRACT
  Filled 2021-10-01: qty 3

## 2021-10-01 MED ORDER — PREDNISONE 20 MG PO TABS
40.0000 mg | ORAL_TABLET | Freq: Every day | ORAL | Status: DC
  Administered 2021-10-02 – 2021-10-03 (×2): 40 mg via ORAL
  Filled 2021-10-01 (×2): qty 2

## 2021-10-01 MED ORDER — MONTELUKAST SODIUM 10 MG PO TABS
10.0000 mg | ORAL_TABLET | Freq: Every day | ORAL | Status: DC
  Administered 2021-10-01 – 2021-10-02 (×2): 10 mg via ORAL
  Filled 2021-10-01 (×2): qty 1

## 2021-10-01 MED ORDER — ONDANSETRON HCL 4 MG PO TABS: 4.0000 mg | ORAL_TABLET | Freq: Four times a day (QID) | ORAL | Status: DC | PRN

## 2021-10-01 MED ORDER — METHYLPREDNISOLONE SODIUM SUCC 125 MG IJ SOLR
125.0000 mg | Freq: Once | INTRAMUSCULAR | Status: AC
  Administered 2021-10-01: 125 mg via INTRAVENOUS
  Filled 2021-10-01: qty 2

## 2021-10-01 MED ORDER — ALBUTEROL SULFATE (2.5 MG/3ML) 0.083% IN NEBU: 2.5000 mg | INHALATION_SOLUTION | Freq: Four times a day (QID) | RESPIRATORY_TRACT | Status: DC

## 2021-10-01 MED ORDER — IPRATROPIUM-ALBUTEROL 0.5-2.5 (3) MG/3ML IN SOLN
3.0000 mL | Freq: Four times a day (QID) | RESPIRATORY_TRACT | Status: DC
  Administered 2021-10-01 – 2021-10-02 (×3): 3 mL via RESPIRATORY_TRACT
  Filled 2021-10-01 (×3): qty 3

## 2021-10-01 MED ORDER — ALBUTEROL SULFATE (2.5 MG/3ML) 0.083% IN NEBU: 2.5000 mg | INHALATION_SOLUTION | RESPIRATORY_TRACT | Status: DC | PRN

## 2021-10-01 MED ORDER — ACETAMINOPHEN 325 MG PO TABS: 650.0000 mg | ORAL_TABLET | Freq: Four times a day (QID) | ORAL | Status: DC | PRN

## 2021-10-01 MED ORDER — ACETAMINOPHEN 650 MG RE SUPP: 650.0000 mg | Freq: Four times a day (QID) | RECTAL | Status: DC | PRN

## 2021-10-01 NOTE — ED Triage Notes (Signed)
Pt arrived via POV, c/o SOB and fluctuating spo2 at home. Hypoxic in the 80s in triage. Placed on 2L [place] with good effect. Endorses some chest pain yesterday, she believed was gas and shortly passed.

## 2021-10-01 NOTE — H&P (Signed)
History and Physical    Helen Vasquez [id number] DOB: 05-11-1956 DOA: 10/01/2021  PCP: [facility]   Patient coming from: Home.  I have personally briefly reviewed patient's old medical records in [place]  Chief Complaint: Shortness of breath.  HPI: Helen Vasquez is a 66 y.o. female with medical history significant of asthma, COPD, seasonal allergies, hypertension, obesity recently admitted overnight on 09/12/2021 due to asthma/COPD exacerbation who is coming to the emergency department due to progressively worse dyspnea associated with wheezing and nonproductive cough that did not respond to her albuterol inhaler at home.  No fever, chills, but feels fatigue.  She has had some frontal sinus pressure, rhinorrhea, but no sore throat or hemoptysis.  No chest pain, palpitations, diaphoresis, PND, orthopnea or recent pitting edema of the lower extremities.  She stated that she only had 1 exacerbation in over 20 years while living in [place], but in the last 2 years after moving to [place] she has had numerous issues and admissions due to allergy/asthma symptoms.  She has also had frequent bloating associated with constipation recently.  No significant abdominal pain, nausea, emesis, weight loss, melena or hematochezia.  No flank pain, dysuria, frequency or hematuria.  No polyuria, polydipsia, polyphagia or blurred vision.  ED Course: Initial vital signs were temperature 98.1 F, pulse 105, respirations 26, BP 147/123 and O2 sat 85% on room air.  O2 sat now is in the high 90s on 2 LPM via nasal cannula.  She received bronchodilators and was given 125 mg of methylprednisolone in the emergency department.  I added K-Lor 40 mEq p.o. and magnesium sulfate 2 g IVPB.  Lab work: CBC was normal.  CMP showed a potassium of 3.3 mmol/L and glucose 117 mg/dL.  Troponin was negative twice.  Coronavirus influenza PCR unremarkable.  Imaging: One-view portable chest radiograph showed  enlargement of the cardiac silhouette but no acute infiltrate.   Review of Systems: As per HPI otherwise all other systems reviewed and are negative.  Past Medical History:  Diagnosis Date   Asthma    COPD (chronic obstructive pulmonary disease) (HCC)    High blood pressure     History reviewed. No pertinent surgical history.  Social History  reports that she has quit smoking. Her smoking use included cigarettes. She has never used smokeless tobacco. She reports that she does not drink alcohol and does not use drugs.  Allergies  Allergen Reactions   Bee Venom Anaphylaxis and Hives   Ace Inhibitors Swelling    Reaction(s): Unknown   Penicillin G Hives    Family History  Problem Relation Age of Onset   Hypertension Other    Prior to Admission medications   Medication Sig Start Date End Date Taking? Authorizing Provider  albuterol (PROVENTIL) (2.5 MG/3ML) 0.083% nebulizer solution Take 3 mLs (2.5 mg total) by nebulization every 4 (four) hours as needed for wheezing or shortness of breath. 09/13/21  Yes Debbe Odea, MD  albuterol (VENTOLIN HFA) 108 (90 Base) MCG/ACT inhaler Inhale 2 puffs into the lungs every 6 (six) hours as needed for wheezing or shortness of breath. 09/13/21  Yes Debbe Odea, MD  aspirin EC 81 MG tablet Take 81 mg by mouth daily. Swallow whole.   Yes [provider]  cholecalciferol (VITAMIN D3) 25 MCG (1000 UNIT) tablet Take 1,000 Units by mouth daily.   Yes [provider]  guaifenesin (HUMIBID E) 400 MG TABS tablet Take 400 mg by mouth every 6 (six) hours as needed (  cough).   Yes [provider]  losartan-hydrochlorothiazide (HYZAAR) 100-25 MG tablet Take 1 tablet by mouth daily. 09/10/21  Yes [provider]  Omega-3 1000 MG CAPS Take 1,000 mg by mouth daily.   Yes [provider]  budesonide-formoterol (SYMBICORT) 80-4.5 MCG/ACT inhaler Inhale 2 puffs into the lungs in the morning and at bedtime. 08/02/21    RaspetDerry Skill, PA-C    Physical Exam: Vitals:   10/01/21 1415 10/01/21 1430 10/01/21 1601 10/01/21 1630  BP: 131/83 (!) 147/96  (!) 154/76  Pulse: 88 96  100  Resp: 13 20  16   Temp:      TempSrc:      SpO2: 98% 99% 99% 98%    Constitutional: NAD, calm, comfortable Eyes: PERRL, lids and conjunctivae normal ENMT: Mucous membranes are moist. Posterior pharynx clear of any exudate or lesions. Neck: normal, supple, no masses, no thyromegaly Respiratory: Decreased breath sounds with bilateral rhonchi and wheezing, no crackles. Normal respiratory effort. No accessory muscle use.  Cardiovascular: Regular rate and rhythm, no murmurs / rubs / gallops. No extremity edema. 2+ pedal pulses. No carotid bruits.  Abdomen: Obese, no distention.  Soft, no tenderness, no masses palpated. No hepatosplenomegaly. Bowel sounds positive.  Musculoskeletal: no clubbing / cyanosis.  Mild generalized weakness.  Good ROM, no contractures. Normal muscle tone.  Skin: no rashes, lesions, ulcers. No induration Neurologic: CN 2-12 grossly intact. Sensation intact, DTR normal. Strength 5/5 in all 4.  Psychiatric: Normal judgment and insight. Alert and oriented x 3. Normal mood.   Labs on Admission: I have personally reviewed following labs and imaging studies  CBC: Recent Labs  Lab 10/01/21 0916  WBC 8.1  NEUTROABS 4.7  HGB 14.1  HCT 40.4  MCV 83.5  PLT [id number]    Basic Metabolic Panel: Recent Labs  Lab 10/01/21 0916 10/01/21 1230  NA 139  --   K 3.3*  --   CL 104  --   CO2 28  --   GLUCOSE 117*  --   BUN 10  --   CREATININE 0.79  --   CALCIUM 9.4  --   MG  --  2.1  PHOS  --  2.7    GFR: CrCl cannot be calculated (Unknown ideal weight.).  Liver Function Tests: Recent Labs  Lab 10/01/21 0916  AST 16  ALT 17  ALKPHOS 54  BILITOT 1.2  PROT 7.1  ALBUMIN 3.9   Radiological Exams on Admission: DG Chest Port 1 View  Result Date: 10/01/2021 CLINICAL DATA:  Shortness of breath and  fluctuating oxygen saturation at home, hypoxia in the 80%s in triage, chest pain yesterday EXAM: PORTABLE CHEST 1 VIEW COMPARISON:  Portable exam 1028 hours compared to 09/12/2021 FINDINGS: Enlargement of cardiac silhouette with prominent central pulmonary arteries question pulmonary arterial hypertension. Mediastinal contours otherwise normal. Lungs clear. No infiltrate, pleural effusion, or pneumothorax. Bones demineralized. IMPRESSION: Enlargement of cardiac silhouette with question pulmonary arterial hypertension. No acute infiltrate. Electronically Signed   By: Lavonia Dana M.D.   On: 10/01/2021 10:52    EKG: Independently reviewed.  Vent. rate 94 BPM PR interval 173 ms QRS duration 83 ms QT/QTcB 365/457 ms P-R-T axes 21 -8 71 Sinus rhythm Low voltage, precordial leads  Assessment/Plan Principal Problem:   Acute respiratory failure with hypoxia (HCC) In the setting of   Acute exacerbation of chronic obstructive pulmonary disease (COPD) (HCC) Observation/telemetry. Continue supplemental oxygen. Continue with scheduled and as needed bronchodilators. Continue methylprednisolone followed by prednisone taper.  Begin loratadine and montelukast daily. I have recommended her to see pulmonology and allergy medicine.  Active Problems:   Hypokalemia Correcting. Magnesium was supplemented. Follow potassium level.    Hypertension Continue Hyzaar 100/25 mg p.o. daily. Monitor BP, renal function electrolytes.    Abdominal bloating/constipation This is a chronic/recurring issue. However, has been happening more often lately.      DVT prophylaxis: Lovenox SQ. Code Status:   Full code. Family Communication:   Disposition Plan:   Patient is from:  Home.  Anticipated DC to:  Home.  Anticipated DC date:  10/02/2021 or 10/03/2021.  Anticipated DC barriers: Clinical status.  Consults called:   Admission status:  Observation/telemetry.  Severity of Illness: High severity in the setting  of asthma exacerbation with oxygen requirement.  Reubin Milan MD [facility]  How to contact the [facility] Attending or Consulting provider [place] or covering provider during after hours [place], for this patient?   Check the care team in [facility] and look for a) attending/consulting [facility] provider listed and b) the [facility] team listed Log into [email or web address] and use [place]'s universal password to access. If you do not have the password, please contact the hospital operator. Locate the [facility] provider you are looking for under [facility] and page to a number that you can be directly reached. If you still have difficulty reaching the provider, please page the [facility] (Director on Call) for the Hospitalists listed on amion for assistance.  10/01/2021, 6:27 PM   This document was prepared using Dragon voice recognition software and may contain some unintended transcription errors.

## 2021-10-01 NOTE — ED Notes (Signed)
Pt placed on 2 L/M nasal cannula due to initial SpO2 readings in 86-88% RA.

## 2021-10-01 NOTE — ED Provider Notes (Signed)
[facility]-EMERGENCY DEPT Provider Note   CSN: [id number] Arrival date & time: 10/01/21  0859     History  Chief Complaint  Patient presents with   Shortness of Breath    Helen Vasquez is a 66 y.o. female.  67 year old female with prior medical history as detailed below presents for evaluation.  Patient with history of COPD.  She reports that she had increased wheezing and shortness of breath over the last 2 days.  Reports increased cough.  She reports shortness of breath.  She denies fever.  She reports using albuterol at home without improvement.  The history is provided by the patient.  Shortness of Breath Severity:  Mild Onset quality:  Sudden Duration:  1 day Timing:  Constant Progression:  Worsening Chronicity:  New Context: activity   Relieved by:  Nothing Worsened by:  Nothing     Home Medications Prior to Admission medications   Medication Sig Start Date End Date Taking? Authorizing Provider  albuterol (PROVENTIL) (2.5 MG/3ML) 0.083% nebulizer solution Take 3 mLs (2.5 mg total) by nebulization every 4 (four) hours as needed for wheezing or shortness of breath. 09/13/21   Calvert Cantor, MD  albuterol (VENTOLIN HFA) 108 (90 Base) MCG/ACT inhaler Inhale 2 puffs into the lungs every 6 (six) hours as needed for wheezing or shortness of breath. 09/13/21   Calvert Cantor, MD  budesonide-formoterol (SYMBICORT) 80-4.5 MCG/ACT inhaler Inhale 2 puffs into the lungs in the morning and at bedtime. 08/02/21   Raspet, Noberto Retort, PA-C  predniSONE (DELTASONE) 20 MG tablet Take 2 tablets (40 mg total) by mouth daily. 09/13/21   Calvert Cantor, MD      Allergies    Ace inhibitors and Penicillin g    Review of Systems   Review of Systems  Respiratory:  Positive for shortness of breath.   All other systems reviewed and are negative.  Physical Exam Updated Vital Signs BP (!) 160/142    Pulse 95    Temp 98.1 F (36.7 C) (Oral)    Resp (!) 21    SpO2 (!) 85%   Physical Exam Vitals and nursing note reviewed.  Constitutional:      General: She is not in acute distress.    Appearance: Normal appearance. She is well-developed.  HENT:     Head: Normocephalic and atraumatic.  Eyes:     Conjunctiva/sclera: Conjunctivae normal.     Pupils: Pupils are equal, round, and reactive to light.  Cardiovascular:     Rate and Rhythm: Normal rate and regular rhythm.     Heart sounds: Normal heart sounds.  Pulmonary:     Effort: Pulmonary effort is normal. No respiratory distress.     Breath sounds: Wheezing present.     Comments: Patient with diffuse expiratory wheezes throughout lung fields Abdominal:     General: There is no distension.     Palpations: Abdomen is soft.     Tenderness: There is no abdominal tenderness.  Musculoskeletal:        General: No deformity. Normal range of motion.     Cervical back: Normal range of motion and neck supple.  Skin:    General: Skin is warm and dry.  Neurological:     General: No focal deficit present.     Mental Status: She is alert and oriented to person, place, and time.    ED Results / Procedures / Treatments   Labs (all labs ordered are listed, but only abnormal results are displayed) Labs  Reviewed  CBC WITH DIFFERENTIAL/PLATELET - Abnormal; Notable for the following components:      Result Value   Eosinophils Absolute 0.6 (*)    All other components within normal limits  COMPREHENSIVE METABOLIC PANEL - Abnormal; Notable for the following components:   Potassium 3.3 (*)    Glucose, Bld 117 (*)    All other components within normal limits  RESP PANEL BY RT-PCR (FLU A&B, COVID) ARPGX2  TROPONIN I (HIGH SENSITIVITY)  TROPONIN I (HIGH SENSITIVITY)    EKG EKG Interpretation  Date/Time:  Tuesday October 01 2021 09:49:45 EST Ventricular Rate:  94 PR Interval:  173 QRS Duration: 83 QT Interval:  365 QTC Calculation: 457 R Axis:   -8 Text Interpretation: Sinus rhythm Low voltage, precordial leads  Confirmed by Kristine Royal [phone]) on 10/01/2021 10:05:19 AM  Radiology No results found.  Procedures Procedures    Medications Ordered in ED Medications  ipratropium-albuterol (DUONEB) 0.5-2.5 (3) MG/3ML nebulizer solution 3 mL (3 mLs Nebulization Given 10/01/21 0926)  methylPREDNISolone sodium succinate (SOLU-MEDROL) 125 mg/2 mL injection 125 mg (125 mg Intravenous Given 10/01/21 6553)    ED Course/ Medical Decision Making/ A&P                           Medical Decision Making Amount and/or Complexity of Data Reviewed Labs: ordered. Radiology: ordered.  Risk Prescription drug management. Decision regarding hospitalization.    Medical Screen Complete  This patient presented to the ED with complaint of shortness of breath, wheezing.  This complaint involves an extensive number of treatment options. The initial differential diagnosis includes, but is not limited to, COPD exacerbation, pneumonia, bronchospasm, metabolic abnormality  This presentation is: Acute, Chronic, Previously Undiagnosed, Uncertain Prognosis, Complicated, Systemic Symptoms, and Threat to Life/Bodily Function  Patient presented with complaint of shortness of breath.  On exam she has diffuse expiratory wheezing.  She is noted to be hypoxic on room air.  Patient improved with supplemental O2 and bronchospasm.  Patient is improved with supplemental O2 and nebulizer treatment.  No findings suggestive of infectious process on initial evaluation.  Patient would benefit from admission.  Hospital services were case will evaluate for same.  Co morbidities that complicated the patient's evaluation  COPD   Additional history obtained: External records from outside sources obtained and reviewed including prior ED visits and prior Inpatient records.    Lab Tests:  I ordered and personally interpreted labs.  The pertinent results include: CBC, CMP, troponin, COVID testing   Imaging Studies  ordered:  I ordered imaging studies including chest x-ray I independently visualized and interpreted obtained imaging which showed NAD I agree with the radiologist interpretation.   Cardiac Monitoring:  The patient was maintained on a cardiac monitor.  I personally viewed and interpreted the cardiac monitor which showed an underlying rhythm of: Sinus rhythm   Medicines ordered:  I ordered medication including DuoNeb, Solu-Medrol for bronchospasm, wheezing Reevaluation of the patient after these medicines showed that the patient: improved     Consultations Obtained:  I consulted Dr. Robb Matar with the hospitalist service,  and discussed lab and imaging findings as well as pertinent plan of care.  He will evaluate for admission   Problem List / ED Course:  COPD exacerbation   Reevaluation:  After the interventions noted above, I reevaluated the patient and found that they have: improved    Dispostion:  After consideration of the diagnostic results and the patients response to treatment,  I feel that the patent would benefit from admission for further work-up and treatment.          Final Clinical Impression(s) / ED Diagnoses Final diagnoses:  COPD exacerbation (HCC)    Rx / DC Orders ED Discharge Orders     None         Wynetta FinesMessick, Laekyn Rayos C, MD 10/01/21 1310

## 2021-10-02 ENCOUNTER — Other Ambulatory Visit: Payer: Self-pay

## 2021-10-02 DIAGNOSIS — I10 Essential (primary) hypertension: Secondary | ICD-10-CM | POA: Diagnosis not present

## 2021-10-02 DIAGNOSIS — J441 Chronic obstructive pulmonary disease with (acute) exacerbation: Secondary | ICD-10-CM | POA: Diagnosis not present

## 2021-10-02 DIAGNOSIS — J9601 Acute respiratory failure with hypoxia: Secondary | ICD-10-CM | POA: Diagnosis not present

## 2021-10-02 DIAGNOSIS — E876 Hypokalemia: Secondary | ICD-10-CM

## 2021-10-02 LAB — BASIC METABOLIC PANEL
Anion gap: 8 (ref 5–15)
BUN: 21 mg/dL (ref 8–23)
CO2: 25 mmol/L (ref 22–32)
Calcium: 9.1 mg/dL (ref 8.9–10.3)
Chloride: 102 mmol/L (ref 98–111)
Creatinine, Ser: 0.93 mg/dL (ref 0.44–1.00)
GFR, Estimated: >60 mL/min (ref >60)
Glucose, Bld: 150 mg/dL — ABNORMAL HIGH (ref 70–99)
Potassium: 4.4 mmol/L (ref 3.5–5.1)
Sodium: 135 mmol/L (ref 135–145)

## 2021-10-02 MED ORDER — FLUTICASONE PROPIONATE 50 MCG/ACT NA SUSP
2.0000 | Freq: Every day | NASAL | Status: DC
  Administered 2021-10-02 – 2021-10-03 (×2): 2 via NASAL
  Filled 2021-10-02: qty 16

## 2021-10-02 MED ORDER — POLYETHYLENE GLYCOL 3350 17 G PO PACK
17.0000 g | PACK | Freq: Two times a day (BID) | ORAL | Status: DC
  Administered 2021-10-02 – 2021-10-03 (×3): 17 g via ORAL
  Filled 2021-10-02 (×3): qty 1

## 2021-10-02 MED ORDER — BISACODYL 10 MG RE SUPP
10.0000 mg | Freq: Once | RECTAL | Status: AC
Start: 2021-10-02 — End: 2021-10-02
  Administered 2021-10-02: 10 mg via RECTAL
  Filled 2021-10-02: qty 1

## 2021-10-02 MED ORDER — BUDESONIDE 0.5 MG/2ML IN SUSP
0.5000 mg | Freq: Two times a day (BID) | RESPIRATORY_TRACT | Status: DC
  Administered 2021-10-02 – 2021-10-03 (×3): 0.5 mg via RESPIRATORY_TRACT
  Filled 2021-10-02 (×3): qty 2

## 2021-10-02 MED ORDER — SORBITOL 70 % SOLN
30.0000 mL | Freq: Once | Status: AC
  Administered 2021-10-02: 30 mL via ORAL
  Filled 2021-10-02: qty 30

## 2021-10-02 MED ORDER — PANTOPRAZOLE SODIUM 40 MG PO TBEC
40.0000 mg | DELAYED_RELEASE_TABLET | Freq: Every day | ORAL | Status: DC
  Administered 2021-10-02 – 2021-10-03 (×2): 40 mg via ORAL
  Filled 2021-10-02 (×2): qty 1

## 2021-10-02 MED ORDER — LOSARTAN POTASSIUM-HCTZ 100-25 MG PO TABS: 1.0000 | ORAL_TABLET | Freq: Every day | ORAL | Status: DC

## 2021-10-02 MED ORDER — SENNOSIDES-DOCUSATE SODIUM 8.6-50 MG PO TABS
1.0000 | ORAL_TABLET | Freq: Two times a day (BID) | ORAL | Status: DC
  Administered 2021-10-02 – 2021-10-03 (×3): 1 via ORAL
  Filled 2021-10-02 (×3): qty 1

## 2021-10-02 MED ORDER — SIMETHICONE 80 MG PO CHEW
160.0000 mg | CHEWABLE_TABLET | Freq: Four times a day (QID) | ORAL | Status: DC
  Administered 2021-10-02 – 2021-10-03 (×6): 160 mg via ORAL
  Filled 2021-10-02 (×6): qty 2

## 2021-10-02 MED ORDER — LOSARTAN POTASSIUM 50 MG PO TABS
100.0000 mg | ORAL_TABLET | Freq: Every day | ORAL | Status: DC
  Administered 2021-10-02 – 2021-10-03 (×2): 100 mg via ORAL
  Filled 2021-10-02 (×2): qty 2

## 2021-10-02 MED ORDER — INFLUENZA VAC A&B SA ADJ QUAD 0.5 ML IM PRSY
0.5000 mL | PREFILLED_SYRINGE | INTRAMUSCULAR | Status: DC
  Filled 2021-10-02: qty 0.5

## 2021-10-02 MED ORDER — IPRATROPIUM-ALBUTEROL 0.5-2.5 (3) MG/3ML IN SOLN
3.0000 mL | Freq: Three times a day (TID) | RESPIRATORY_TRACT | Status: DC
  Administered 2021-10-02 – 2021-10-03 (×3): 3 mL via RESPIRATORY_TRACT
  Filled 2021-10-02 (×3): qty 3

## 2021-10-02 MED ORDER — ASPIRIN EC 81 MG PO TBEC
81.0000 mg | DELAYED_RELEASE_TABLET | Freq: Every day | ORAL | Status: DC
Start: 2021-10-02 — End: 2021-10-03
  Administered 2021-10-02 – 2021-10-03 (×2): 81 mg via ORAL
  Filled 2021-10-02 (×2): qty 1

## 2021-10-02 MED ORDER — AZITHROMYCIN 250 MG PO TABS
500.0000 mg | ORAL_TABLET | Freq: Every day | ORAL | Status: DC
  Administered 2021-10-02 – 2021-10-03 (×2): 500 mg via ORAL
  Filled 2021-10-02 (×2): qty 2

## 2021-10-02 MED ORDER — DOXYCYCLINE HYCLATE 100 MG PO TABS
100.0000 mg | ORAL_TABLET | Freq: Two times a day (BID) | ORAL | Status: DC
  Administered 2021-10-02: 100 mg via ORAL
  Filled 2021-10-02: qty 1

## 2021-10-02 MED ORDER — HYDROCHLOROTHIAZIDE 25 MG PO TABS
25.0000 mg | ORAL_TABLET | Freq: Every day | ORAL | Status: DC
  Administered 2021-10-02 – 2021-10-03 (×2): 25 mg via ORAL
  Filled 2021-10-02 (×3): qty 1

## 2021-10-02 NOTE — ED Notes (Signed)
Pt titrated down to 1L O2. Saturations have been 94 to 100% on 2L

## 2021-10-02 NOTE — Progress Notes (Signed)
Patient remained in 94-96% o2 saturation when ambulating on room air.

## 2021-10-02 NOTE — Progress Notes (Signed)
Pt c/o of pain in her 4th right finger and states " it just cramps and it hurts down to my wrist and sometimes my toes do that too. I also get a pain around my head too and I'm worried." This RN assessed pt and noted a slight muscle contraction on 4th right finger. Pain noted with palpatation.  Will notify MD.

## 2021-10-02 NOTE — Progress Notes (Signed)
PROGRESS NOTE    Helen Vasquez  [id number] DOB: 1956-04-17 DOA: 10/01/2021 PCP: [facility]    Chief Complaint  Patient presents with   Shortness of Breath    Brief Narrative:  Patient is a pleasant 66 year old female, history of asthma, COPD, seasonal allergies, hypertension, obesity, vocal cord dysfunction presented overnight on 09/12/2021 due to asthma/COPD exacerbation presented back to the ED with progressive worsening dyspnea with associated wheezing, nonproductive cough with no improvement on albuterol inhaler.  Chest x-ray done negative for any acute infiltrates.  Patient admitted for acute COPD exacerbation.   Assessment & Plan:   Principal Problem:   Acute respiratory failure with hypoxia (HCC) Active Problems:   Acute exacerbation of chronic obstructive pulmonary disease (COPD) (HCC)   Hypokalemia   Hypertension   Abdominal bloating  #1 acute hypoxic respiratory failure secondary to acute COPD exacerbation -Patient admitted with worsening shortness of breath, new O2 requirements, associated wheezing, nonproductive cough. -Chest x-ray negative for any acute infiltrates. -Patient placed on IV Solu-Medrol and being transitioned to a prednisone taper with some clinical improvement. -Placed on Pulmicort, azithromycin, Flonase, PPI. -Continue scheduled duo nebs, Claritin, Singulair, steroid taper. -We will need outpatient follow-up with PCP may benefit from referral to pulmonary in the outpatient setting.  2.  Hypokalemia -Repleted.  3.  Hypertension -Continue home regimen Hyzaar.  4.  Abdominal bloating/constipation -Noted to be chronic in nature. -Placed on MiraLAX twice daily, Senokot-S twice daily.   DVT prophylaxis: Lovenox Code Status: Full Family Communication: Updated patient.  No family at bedside. Disposition:   Status is: Observation  The patient remains OBS appropriate and will d/c before 2 midnights.      Consultants:   None  Procedures:  Chest x-ray 10/01/2021  Antimicrobials:  Azithromycin 10/02/2021>>>>   Subjective: Sitting up in bed getting help ordering her lunch.  No chest pain.  Feels breathing has improved with wheezing.  Overall feeling better than she did on admission.  Patient with complaints of abdominal bloating and constipation.  Objective: Vitals:   10/02/21 0800 10/02/21 0810 10/02/21 0858 10/02/21 0904  BP: 130/67 112/75  119/74  Pulse: 87 83  88  Resp: 19 18  16   Temp:  97.8 F (36.6 C)  98.3 F (36.8 C)  TempSrc:  Oral  Oral  SpO2: 94% 96%  97%  Weight:   94.1 kg   Height:   5\' 3"  (1.6 m)    No intake or output data in the 24 hours ending 10/02/21 1012 Filed Weights   10/02/21 0858  Weight: 94.1 kg    Examination:  General exam: Appears calm and comfortable  Respiratory system: Minimal expiratory wheezing.  Fair air movement.  Speaking in full sentences.  No use of accessory muscles of respiration.   Cardiovascular system: S1 & S2 heard, RRR. No JVD, murmurs, rubs, gallops or clicks. No pedal edema. Gastrointestinal system: Abdomen is nondistended, soft and nontender. No organomegaly or masses felt. Normal bowel sounds heard. Central nervous system: Alert and oriented. No focal neurological deficits. Extremities: Symmetric 5 x 5 power. Skin: No rashes, lesions or ulcers Psychiatry: Judgement and insight appear normal. Mood & affect appropriate.     Data Reviewed: I have personally reviewed following labs and imaging studies  CBC: Recent Labs  Lab 10/01/21 0916  WBC 8.1  NEUTROABS 4.7  HGB 14.1  HCT 40.4  MCV 83.5  PLT [id number]    Basic Metabolic Panel: Recent Labs  Lab 10/01/21 0916 10/01/21 1230 10/02/21 0319  NA 139  --  135  K 3.3*  --  4.4  CL 104  --  102  CO2 28  --  25  GLUCOSE 117*  --  150*  BUN 10  --  21  CREATININE 0.79  --  0.93  CALCIUM 9.4  --  9.1  MG  --  2.1  --   PHOS  --  2.7  --     GFR: Estimated Creatinine Clearance:  65.8 mL/min (by C-G formula based on SCr of 0.93 mg/dL).  Liver Function Tests: Recent Labs  Lab 10/01/21 0916  AST 16  ALT 17  ALKPHOS 54  BILITOT 1.2  PROT 7.1  ALBUMIN 3.9    CBG: No results for input(s): GLUCAP in the last 168 hours.   Recent Results (from the past 240 hour(s))  Resp Panel by RT-PCR (Flu A&B, Covid) Nasopharyngeal Swab     Status: None   Collection Time: 10/01/21  9:57 AM   Specimen: Nasopharyngeal Swab; Nasopharyngeal(NP) swabs in vial transport medium  Result Value Ref Range Status   SARS Coronavirus 2 by RT PCR NEGATIVE NEGATIVE Final    Comment: (NOTE) SARS-CoV-2 target nucleic acids are NOT DETECTED.  The SARS-CoV-2 RNA is generally detectable in upper respiratory specimens during the acute phase of infection. The lowest concentration of SARS-CoV-2 viral copies this assay can detect is 138 copies/mL. A negative result does not preclude SARS-Cov-2 infection and should not be used as the sole basis for treatment or other patient management decisions. A negative result may occur with  improper specimen collection/handling, submission of specimen other than nasopharyngeal swab, presence of viral mutation(s) within the areas targeted by this assay, and inadequate number of viral copies(<138 copies/mL). A negative result must be combined with clinical observations, patient history, and epidemiological information. The expected result is Negative.  Fact Sheet for Patients:  EntrepreneurPulse.com.au  Fact Sheet for Healthcare Providers:  IncredibleEmployment.be  This test is no t yet approved or cleared by the [place] FDA and  has been authorized for detection and/or diagnosis of SARS-CoV-2 by FDA under an Emergency Use Authorization (EUA). This EUA will remain  in effect (meaning this test can be used) for the duration of the COVID-19 declaration under Section 564(b)(1) of the Act, 21 U.S.C.section  360bbb-3(b)(1), unless the authorization is terminated  or revoked sooner.       Influenza A by PCR NEGATIVE NEGATIVE Final   Influenza B by PCR NEGATIVE NEGATIVE Final    Comment: (NOTE) The Xpert Xpress SARS-CoV-2/FLU/RSV plus assay is intended as an aid in the diagnosis of influenza from Nasopharyngeal swab specimens and should not be used as a sole basis for treatment. Nasal washings and aspirates are unacceptable for Xpert Xpress SARS-CoV-2/FLU/RSV testing.  Fact Sheet for Patients: EntrepreneurPulse.com.au  Fact Sheet for Healthcare Providers: IncredibleEmployment.be  This test is not yet approved or cleared by the [place] FDA and has been authorized for detection and/or diagnosis of SARS-CoV-2 by FDA under an Emergency Use Authorization (EUA). This EUA will remain in effect (meaning this test can be used) for the duration of the COVID-19 declaration under Section 564(b)(1) of the Act, 21 U.S.C. section 360bbb-3(b)(1), unless the authorization is terminated or revoked.  Performed at [facility], [place] [address], [place], [place] [address]          Radiology Studies: [facility] Chest Port 1 View  Result Date: 10/01/2021 CLINICAL DATA:  Shortness of breath and fluctuating oxygen saturation at home,  hypoxia in the 80%s in triage, chest pain yesterday EXAM: PORTABLE CHEST 1 VIEW COMPARISON:  Portable exam 1028 hours compared to 09/12/2021 FINDINGS: Enlargement of cardiac silhouette with prominent central pulmonary arteries question pulmonary arterial hypertension. Mediastinal contours otherwise normal. Lungs clear. No infiltrate, pleural effusion, or pneumothorax. Bones demineralized. IMPRESSION: Enlargement of cardiac silhouette with question pulmonary arterial hypertension. No acute infiltrate. Electronically Signed   By: Lavonia Dana M.D.   On: 10/01/2021 10:52        Scheduled Meds:  aspirin EC  81 mg Oral Daily    azithromycin  500 mg Oral Daily   budesonide (PULMICORT) nebulizer solution  0.5 mg Nebulization BID   doxycycline  100 mg Oral Q12H   enoxaparin (LOVENOX) injection  40 mg Subcutaneous Q24H   fluticasone  2 spray Each Nare Daily   losartan  100 mg Oral Daily   And   hydrochlorothiazide  25 mg Oral Daily   [START ON 10/03/2021] influenza vaccine adjuvanted  0.5 mL Intramuscular Tomorrow-1000   ipratropium-albuterol  3 mL Nebulization QID   loratadine  10 mg Oral Daily   montelukast  10 mg Oral QHS   pantoprazole  40 mg Oral Q0600   polyethylene glycol  17 g Oral BID   predniSONE  40 mg Oral Q breakfast   senna-docusate  1 tablet Oral BID   simethicone  160 mg Oral QID   Continuous Infusions:   LOS: 0 days    Time spent: 40 mins    Irine Seal, MD [facility]   To contact the attending provider between 7A-7P or the covering provider during after hours 7P-7A, please log into the web site [email or web address] and access using universal [place] password for that web site. If you do not have the password, please call the hospital operator.  10/02/2021, 10:12 AM

## 2021-10-02 NOTE — Progress Notes (Signed)
Chaplain engaged in an initial visit with Helen Vasquez.  Chaplain was able to provide her with the Advanced Directive (AD), Healthcare POA paperwork.  Chaplain also provided education on document.  After spending time talking about the AD, Helen Vasquez shared about her healthcare journey and family.  She noted that she is from [place] and while growing up there she always needed asthma treatments.  It was through being in the [employer] and being in other locations that she stopped having as many issues with asthma and her breathing.  She noted that [place] in [place] was very helpful for her and that she rarely had any attacks.  She is now in the process of relocating and deciding what place will be best for her health.  She is between [place] and [place] and desires to live near the water again.    Helen Vasquez also shared about some grief she has suffered in her life which has included the loss of her son in 2014.  She believes that getting counseling when she gets settled will be a great help to her.  She has recognized that she was stressed out and going through a lot in the last couple of years and desires to pick up some healthier tools and habits to get her through.  Chaplain affirmed therapy/counseling as a great outlet.    Chaplain offered support, listening, and presence.    10/02/21 1200  Clinical Encounter Type  Visited With Patient  Visit Type Initial;Social support  Referral From Nurse;Patient  Consult/Referral To Chaplain  Spiritual Encounters  Spiritual Needs Brochure;Literature;Emotional;Grief support  Stress Factors  Patient Stress Factors Health changes;Family relationships

## 2021-10-02 NOTE — Evaluation (Signed)
Occupational Therapy Evaluation Patient Details Name: Helen Vasquez MRN: [id number] DOB: 03/14/1956 Today's Date: 10/02/2021   History of Present Illness Patient is a 66 year old female admitted with progressive worsening dyspnea with associated wheezing, nonproductive cough with no improvement on albuterol inhaler. Patient admitted for acute COPD exacerbation. PMH: asthma, COPD, seasonal allergies, hypertension, obesity, vocal cord dysfunction   Clinical Impression   Patient lives alone in an apartment with elevator access. Does not use any AD and is independent with self care tasks. Currently patient does not require any physical assistance with self care tasks assessed. Note mild wheezing post ambulation in room. Patient states she has questions regarding COPD "I don't understand it much and it scares me, I had one doctor tell me I'm dying at urgent care." Provided patient with educational hand out for COPD and encourage her to ask questions to MD. Also encourage patient to monitor the swelling she reports in her abdomen and to contact her MD if it continues to get worse or if she starts to notice edema in any extremities.  No further acute OT needs at this time, will sign off.      Recommendations for follow up therapy are one component of a multi-disciplinary discharge planning process, led by the attending physician.  Recommendations may be updated based on patient status, additional functional criteria and insurance authorization.   Follow Up Recommendations  No OT follow up    Assistance Recommended at Discharge None     Functional Status Assessment  Patient has not had a recent decline in their functional status  Equipment Recommendations  Tub/shower seat       Precautions / Restrictions Precautions Precautions: None Restrictions Weight Bearing Restrictions: No      Mobility Bed Mobility Overal bed mobility: Independent                  Transfers Overall transfer  level: Independent Equipment used: None                      Balance Overall balance assessment: No apparent balance deficits (not formally assessed)                                         ADL either performed or assessed with clinical judgement   ADL Overall ADL's : Independent                                       General ADL Comments: Patient demonstrates ability to perform self care tasks LB dressing, functional transfers and ambulation in room. Does wheeze with ambulation in room. Provided patient with hand out on COPD as she states she does not have a good understanding of her condition      Pertinent Vitals/Pain Pain Assessment Pain Assessment: No/denies pain     Hand Dominance Left   Extremity/Trunk Assessment Upper Extremity Assessment Upper Extremity Assessment: Overall WFL for tasks assessed   Lower Extremity Assessment Lower Extremity Assessment: Defer to PT evaluation   Cervical / Trunk Assessment Cervical / Trunk Assessment: Normal   Communication Communication Communication: No difficulties   Cognition Arousal/Alertness: Awake/alert Behavior During Therapy: WFL for tasks assessed/performed Overall Cognitive Status: Within Functional Limits for tasks assessed  General Comments  HR 109 with activity            Home Living Family/patient expects to be discharged to:: Private residence Living Arrangements: Alone   Type of Home: Apartment Home Access: Elevator     Home Layout: One level     Bathroom Shower/Tub: Walk-in shower;Tub/shower unit   Bathroom Toilet: Standard     Home Equipment: None          Prior Functioning/Environment Prior Level of Function : Independent/Modified Independent                        OT Problem List: Decreased activity tolerance;Cardiopulmonary status limiting activity         OT Goals(Current  goals can be found in the care plan section) Acute Rehab OT Goals Patient Stated Goal: Lose weight in abdomen OT Goal Formulation: All assessment and education complete, DC therapy   AM-PAC OT "6 Clicks" Daily Activity     Outcome Measure Help from another person eating meals?: None Help from another person taking care of personal grooming?: None Help from another person toileting, which includes using toliet, bedpan, or urinal?: None Help from another person bathing (including washing, rinsing, drying)?: None Help from another person to put on and taking off regular upper body clothing?: None Help from another person to put on and taking off regular lower body clothing?: None 6 Click Score: 24   End of Session Nurse Communication: Mobility status  Activity Tolerance: Patient tolerated treatment well Patient left: in bed;with call bell/phone within reach  OT Visit Diagnosis: Other abnormalities of gait and mobility (R26.89)                Time: 0175-1025 OT Time Calculation (min): 21 min Charges:  OT General Charges $OT Visit: 1 Visit OT Evaluation $OT Eval Low Complexity: 1 Low  Marlyce Huge OT OT pager: [phone]  Carmelia Roller 10/02/2021, 1:04 PM

## 2021-10-03 DIAGNOSIS — J9601 Acute respiratory failure with hypoxia: Secondary | ICD-10-CM | POA: Diagnosis not present

## 2021-10-03 DIAGNOSIS — I10 Essential (primary) hypertension: Secondary | ICD-10-CM | POA: Diagnosis not present

## 2021-10-03 DIAGNOSIS — J441 Chronic obstructive pulmonary disease with (acute) exacerbation: Secondary | ICD-10-CM | POA: Diagnosis not present

## 2021-10-03 DIAGNOSIS — E876 Hypokalemia: Secondary | ICD-10-CM | POA: Diagnosis not present

## 2021-10-03 LAB — BASIC METABOLIC PANEL
Anion gap: 9 (ref 5–15)
BUN: 20 mg/dL (ref 8–23)
CO2: 27 mmol/L (ref 22–32)
Calcium: 9.7 mg/dL (ref 8.9–10.3)
Chloride: 101 mmol/L (ref 98–111)
Creatinine, Ser: 0.89 mg/dL (ref 0.44–1.00)
GFR, Estimated: >60 mL/min (ref >60)
Glucose, Bld: 131 mg/dL — ABNORMAL HIGH (ref 70–99)
Potassium: 3.8 mmol/L (ref 3.5–5.1)
Sodium: 137 mmol/L (ref 135–145)

## 2021-10-03 LAB — CBC
HCT: 35.7 % — ABNORMAL LOW (ref 36.0–46.0)
Hemoglobin: 12.8 g/dL (ref 12.0–15.0)
MCH: 29.6 pg (ref 26.0–34.0)
MCHC: 35.9 g/dL (ref 30.0–36.0)
MCV: 82.4 fL (ref 80.0–100.0)
Platelets: 259 10*3/uL (ref 150–400)
RBC: 4.33 MIL/uL (ref 3.87–5.11)
RDW: 14.8 % (ref 11.5–15.5)
WBC: 12.9 10*3/uL — ABNORMAL HIGH (ref 4.0–10.5)
nRBC: 0 % (ref 0.0–0.2)

## 2021-10-03 LAB — MAGNESIUM: Magnesium: 2.4 mg/dL (ref 1.7–2.4)

## 2021-10-03 MED ORDER — AZITHROMYCIN 250 MG PO TABS: 500.0000 mg | ORAL_TABLET | Freq: Every day | ORAL | 0 refills | Status: AC

## 2021-10-03 MED ORDER — ACETAMINOPHEN 325 MG PO TABS: 650.0000 mg | ORAL_TABLET | Freq: Four times a day (QID) | ORAL | Status: DC | PRN

## 2021-10-03 MED ORDER — FLUTICASONE PROPIONATE 50 MCG/ACT NA SUSP: 2.0000 | Freq: Every day | NASAL | 0 refills | Status: AC

## 2021-10-03 MED ORDER — MONTELUKAST SODIUM 10 MG PO TABS: 10.0000 mg | ORAL_TABLET | Freq: Every day | ORAL | 1 refills | Status: DC

## 2021-10-03 MED ORDER — IPRATROPIUM BROMIDE 0.02 % IN SOLN: 0.5000 mg | Freq: Three times a day (TID) | RESPIRATORY_TRACT | Status: DC

## 2021-10-03 MED ORDER — ALBUTEROL SULFATE (2.5 MG/3ML) 0.083% IN NEBU: 2.5000 mg | INHALATION_SOLUTION | RESPIRATORY_TRACT | 1 refills | Status: DC | PRN

## 2021-10-03 MED ORDER — LORATADINE 10 MG PO TABS: 10.0000 mg | ORAL_TABLET | Freq: Every day | ORAL | 1 refills | Status: DC

## 2021-10-03 MED ORDER — LEVALBUTEROL HCL 0.63 MG/3ML IN NEBU: 0.6300 mg | INHALATION_SOLUTION | Freq: Three times a day (TID) | RESPIRATORY_TRACT | Status: DC

## 2021-10-03 MED ORDER — SENNOSIDES-DOCUSATE SODIUM 8.6-50 MG PO TABS: 1.0000 | ORAL_TABLET | Freq: Two times a day (BID) | ORAL | Status: DC

## 2021-10-03 MED ORDER — SIMETHICONE 80 MG PO CHEW: 160.0000 mg | CHEWABLE_TABLET | Freq: Four times a day (QID) | ORAL | 0 refills | Status: DC

## 2021-10-03 MED ORDER — POLYETHYLENE GLYCOL 3350 17 G PO PACK: 17.0000 g | PACK | Freq: Every day | ORAL | 0 refills | Status: DC

## 2021-10-03 MED ORDER — PANTOPRAZOLE SODIUM 40 MG PO TBEC: 40.0000 mg | DELAYED_RELEASE_TABLET | Freq: Every day | ORAL | 1 refills | Status: DC

## 2021-10-03 MED ORDER — PREDNISONE 20 MG PO TABS: 40.0000 mg | ORAL_TABLET | Freq: Every day | ORAL | 0 refills | Status: AC

## 2021-10-03 MED ORDER — BUDESONIDE-FORMOTEROL FUMARATE 160-4.5 MCG/ACT IN AERO: 2.0000 | INHALATION_SPRAY | Freq: Two times a day (BID) | RESPIRATORY_TRACT | 1 refills | Status: DC

## 2021-10-03 NOTE — Discharge Summary (Signed)
Physician Discharge Summary  Helen Vasquez [id number] DOB: 1955/12/12 DOA: 10/01/2021  PCP: [facility]  Admit date: 10/01/2021 Discharge date: 10/03/2021  Time spent: 55 minutes  Recommendations for Outpatient Follow-up:  Follow-up with [facility] in 2 weeks for hospital follow-up.  On follow-up patient need a basic metabolic profile done to follow-up on electrolytes and renal function.  Patient's acute COPD exacerbation also need to be followed up upon.  Patient may benefit from a pulmonary referral.   Discharge Diagnoses:  Principal Problem:   Acute respiratory failure with hypoxia ([place]) Active Problems:   Acute exacerbation of chronic obstructive pulmonary disease (COPD) (HCC)   Hypokalemia   Hypertension   Abdominal bloating   Discharge Condition: Stable and improved  Diet recommendation: Regular  Filed Weights   10/02/21 0858  Weight: 94.1 kg    History of present illness:  HPI per Dr. Glenford Bayley is a 66 y.o. female with medical history significant of asthma, COPD, seasonal allergies, hypertension, obesity recently admitted overnight on 09/12/2021 due to asthma/COPD exacerbation who is coming to the emergency department due to progressively worse dyspnea associated with wheezing and nonproductive cough that did not respond to her albuterol inhaler at home.  No fever, chills, but feels fatigue.  She has had some frontal sinus pressure, rhinorrhea, but no sore throat or hemoptysis.  No chest pain, palpitations, diaphoresis, PND, orthopnea or recent pitting edema of the lower extremities.  She stated that she only had 1 exacerbation in over 20 years while living in [place], but in the last 2 years after moving to [place] she has had numerous issues and admissions due to allergy/asthma symptoms.   She has also had frequent bloating associated with constipation recently.  No significant abdominal pain, nausea, emesis, weight loss, melena  or hematochezia.  No flank pain, dysuria, frequency or hematuria.  No polyuria, polydipsia, polyphagia or blurred vision.   ED Course: Initial vital signs were temperature 98.1 F, pulse 105, respirations 26, BP 147/123 and O2 sat 85% on room air.  O2 sat now is in the high 90s on 2 LPM via nasal cannula.  She received bronchodilators and was given 125 mg of methylprednisolone in the emergency department.  I added K-Lor 40 mEq p.o. and magnesium sulfate 2 g IVPB.   Lab work: CBC was normal.  CMP showed a potassium of 3.3 mmol/L and glucose 117 mg/dL.  Troponin was negative twice.  Coronavirus influenza PCR unremarkable.   Imaging: One-view portable chest radiograph showed enlargement of the cardiac silhouette but no acute infiltrate.   Hospital Course:  #1 acute hypoxic respiratory failure secondary to acute COPD exacerbation -Patient admitted with worsening shortness of breath, new O2 requirements, associated wheezing, nonproductive cough. -Chest x-ray negative for any acute infiltrates. -Patient placed on IV Solu-Medrol and transitioned to a prednisone taper. -Patient subsequently placed on Pulmicort, azithromycin, Flonase, PPI, scheduled duo nebs, Claritin, Singulair.   -Patient improved clinically during the hospitalization and noted to be ambulating with sats of 96 to 97% on room air.   -Outpatient follow-up with PCP.   -May need outpatient referral to pulmonary.   2.  Hypokalemia -Repleted.  3.  Hypertension -Controlled on home regimen Hyzaar.    4.  Abdominal bloating/constipation -Noted to be chronic in nature. -Placed on MiraLAX twice daily, Senokot-S twice daily. -Patient given sorbitol orally, Dulcolax suppository with no results. -Patient also maintained on simethicone. -Patient subsequently placed on soapsuds enema with results. -Patient was discharged on a  bowel regimen of MiraLAX daily, Senokot-S twice daily. -Outpatient follow-up.    Procedures: Chest x-ray  10/01/2021  Consultations: None  Discharge Exam: Vitals:   10/03/21 1007 10/03/21 1436  BP: 136/85   Pulse: 98   Resp: 20   Temp: 98 F (36.7 C)   SpO2: 96% 96%    General: NAD. Cardiovascular: Regular rate rhythm no murmurs rubs or gallops.  No JVD.  No lower extremity edema. Respiratory: Minimal expiratory wheezing.  No rhonchi.  No crackles.  Fair air movement.  Speaking in full sentences.  No use of accessory muscles of respiration.  Discharge Instructions   Discharge Instructions     Diet general   Complete by: As directed    Increase activity slowly   Complete by: As directed       Allergies as of 10/03/2021       Reactions   Bee Venom Anaphylaxis, Hives   Ace Inhibitors Swelling   Reaction(s): Unknown   Penicillin G Hives        Medication List     STOP taking these medications    budesonide-formoterol 80-4.5 MCG/ACT inhaler Commonly known as: Symbicort Replaced by: budesonide-formoterol 160-4.5 MCG/ACT inhaler       TAKE these medications    acetaminophen 325 MG tablet Commonly known as: TYLENOL Take 2 tablets (650 mg total) by mouth every 6 (six) hours as needed for mild pain (or Fever >/= 101).   albuterol 108 (90 Base) MCG/ACT inhaler Commonly known as: VENTOLIN HFA Inhale 2 puffs into the lungs every 6 (six) hours as needed for wheezing or shortness of breath. What changed: Another medication with the same name was changed. Make sure you understand how and when to take each.   albuterol (2.5 MG/3ML) 0.083% nebulizer solution Commonly known as: PROVENTIL Take 3 mLs (2.5 mg total) by nebulization every 4 (four) hours as needed for wheezing or shortness of breath. Use 3 times daily x5 days, then every 4 hours as needed. What changed: additional instructions   aspirin EC 81 MG tablet Take 81 mg by mouth daily. Swallow whole.   azithromycin 250 MG tablet Commonly known as: ZITHROMAX Take 2 tablets (500 mg total) by mouth daily for 3  days. Start taking on: October 04, 2021   budesonide-formoterol 160-4.5 MCG/ACT inhaler Commonly known as: Symbicort Inhale 2 puffs into the lungs in the morning and at bedtime. Replaces: budesonide-formoterol 80-4.5 MCG/ACT inhaler   cholecalciferol 25 MCG (1000 UNIT) tablet Commonly known as: VITAMIN D3 Take 1,000 Units by mouth daily.   fluticasone 50 MCG/ACT nasal spray Commonly known as: FLONASE Place 2 sprays into both nostrils daily. Start taking on: October 04, 2021   guaifenesin 400 MG Tabs tablet Commonly known as: HUMIBID E Take 400 mg by mouth every 6 (six) hours as needed (cough).   loratadine 10 MG tablet Commonly known as: CLARITIN Take 1 tablet (10 mg total) by mouth daily. Start taking on: October 04, 2021   losartan-hydrochlorothiazide 100-25 MG tablet Commonly known as: HYZAAR Take 1 tablet by mouth daily.   montelukast 10 MG tablet Commonly known as: SINGULAIR Take 1 tablet (10 mg total) by mouth at bedtime.   Omega-3 1000 MG Caps Take 1,000 mg by mouth daily.   pantoprazole 40 MG tablet Commonly known as: PROTONIX Take 1 tablet (40 mg total) by mouth daily at 6 (six) AM. Start taking on: October 04, 2021   polyethylene glycol 17 g packet Commonly known as: MIRALAX / GLYCOLAX Take 17  g by mouth daily.   predniSONE 20 MG tablet Commonly known as: DELTASONE Take 2 tablets (40 mg total) by mouth daily with breakfast for 4 days. Start taking on: October 04, 2021   senna-docusate 8.6-50 MG tablet Commonly known as: Senokot-S Take 1 tablet by mouth 2 (two) times daily.   simethicone 80 MG chewable tablet Commonly known as: MYLICON Chew 2 tablets (160 mg total) by mouth 4 (four) times daily for 7 days.       Allergies  Allergen Reactions   Bee Venom Anaphylaxis and Hives   Ace Inhibitors Swelling    Reaction(s): Unknown   Penicillin G Hives    Follow-up Information     [facility] Schedule an appointment as soon as  possible for a visit in 2 week(s).   Contact information: [place] [address] [phone]                  The results of significant diagnostics from this hospitalization (including imaging, microbiology, ancillary and laboratory) are listed below for reference.    Significant Diagnostic Studies: CT Chest W Contrast  Result Date: 09/12/2021 CLINICAL DATA:  Respiratory illness, questionable airspace disease on chest x-ray today. EXAM: CT CHEST WITH CONTRAST TECHNIQUE: Multidetector CT imaging of the chest was performed during intravenous contrast administration. CONTRAST:  48mL OMNIPAQUE IOHEXOL 350 MG/ML SOLN COMPARISON:  Portable chest today, PA and lateral chest 08/12/2021. FINDINGS: Cardiovascular: There is a prominent pulmonary trunk 3.5 cm indicating arterial hypertension. There is no central embolus. There is mild cardiomegaly with a right chamber predominance suggesting right heart dysfunction, chronicity indeterminate. There are trace calcifications LAD coronary artery. No pericardial effusion is seen and no venous dilatation. There is lipomatous hypertrophy of the intra-atrial septum. The aortic root and ascending segment are both ectatic both measuring 3.8 cm caliber. There are minimal calcifications of the aortic arch. There is moderate tortuosity of the descending segment with no aneurysm or dissection , normal great vessel branching and opacification. Mediastinum/Nodes: No enlarged mediastinal, hilar, or axillary lymph nodes. Thyroid gland, trachea, and esophagus demonstrate no significant findings. Eventrations noted along both hemidiaphragms. Lungs/Pleura: There are atelectatic bands in the lower lobes. There are few linear scar-like opacities in both lungs in the mid to lower lung fields. No nodule or infiltrate. There is a calcified granuloma anteriorly in the left upper lobe. The central airways are clear. There are mild centrilobular  emphysematous changes lung apices. No pleural effusion or pneumothorax. Upper Abdomen: No acute abnormality. There is mild adenomatous hyperplasia of both adrenal glands. Musculoskeletal: There are multilevel thoracic spine collapsed discs, ankylosis across multiple upper to midthoracic spine vertebral bodies, and extensive bridging syndesmophytes. Osteopenia. IMPRESSION: 1. No focal pneumonia is evident. COPD with scattered linear scar-like opacities and lower lobe atelectatic bands. 2. Cardiomegaly with the right chamber predominance indicating right heart dysfunction, with prominent pulmonary trunk indicating arterial hypertension. No central embolus is seen. 3. Aortic and coronary artery atherosclerosis with descending aortic tortuosity, root and ascending segment aortic ectasia, and lipomatous hypertrophy of the interatrial septum. 4. Osteopenia with thoracic spine degenerative changes, bridging syndesmophytes and multilevel vertebral body ankylosis. Electronically Signed   By: Telford Nab M.D.   On: 09/12/2021 22:03   DG Chest Port 1 View  Result Date: 10/01/2021 CLINICAL DATA:  Shortness of breath and fluctuating oxygen saturation at home, hypoxia in the 80%s in triage, chest pain yesterday EXAM: PORTABLE CHEST 1 VIEW COMPARISON:  Portable exam 1028 hours compared  to 09/12/2021 FINDINGS: Enlargement of cardiac silhouette with prominent central pulmonary arteries question pulmonary arterial hypertension. Mediastinal contours otherwise normal. Lungs clear. No infiltrate, pleural effusion, or pneumothorax. Bones demineralized. IMPRESSION: Enlargement of cardiac silhouette with question pulmonary arterial hypertension. No acute infiltrate. Electronically Signed   By: Lavonia Dana M.D.   On: 10/01/2021 10:52   DG Chest Port 1 View  Result Date: 09/12/2021 CLINICAL DATA:  Short of breath and hypoxemia. EXAM: PORTABLE CHEST 1 VIEW COMPARISON:  08/12/2021 FINDINGS: Mild bibasilar airspace disease has  developed since the prior study. Heart size and vascularity normal. Lungs are clear. IMPRESSION: Mild bibasilar atelectasis/infiltrate. Electronically Signed   By: Franchot Gallo M.D.   On: 09/12/2021 18:28    Microbiology: Recent Results (from the past 240 hour(s))  Resp Panel by RT-PCR (Flu A&B, Covid) Nasopharyngeal Swab     Status: None   Collection Time: 10/01/21  9:57 AM   Specimen: Nasopharyngeal Swab; Nasopharyngeal(NP) swabs in vial transport medium  Result Value Ref Range Status   SARS Coronavirus 2 by RT PCR NEGATIVE NEGATIVE Final    Comment: (NOTE) SARS-CoV-2 target nucleic acids are NOT DETECTED.  The SARS-CoV-2 RNA is generally detectable in upper respiratory specimens during the acute phase of infection. The lowest concentration of SARS-CoV-2 viral copies this assay can detect is 138 copies/mL. A negative result does not preclude SARS-Cov-2 infection and should not be used as the sole basis for treatment or other patient management decisions. A negative result may occur with  improper specimen collection/handling, submission of specimen other than nasopharyngeal swab, presence of viral mutation(s) within the areas targeted by this assay, and inadequate number of viral copies(<138 copies/mL). A negative result must be combined with clinical observations, patient history, and epidemiological information. The expected result is Negative.  Fact Sheet for Patients:  EntrepreneurPulse.com.au  Fact Sheet for Healthcare Providers:  IncredibleEmployment.be  This test is no t yet approved or cleared by the [place] FDA and  has been authorized for detection and/or diagnosis of SARS-CoV-2 by FDA under an Emergency Use Authorization (EUA). This EUA will remain  in effect (meaning this test can be used) for the duration of the COVID-19 declaration under Section 564(b)(1) of the Act, 21 U.S.C.section 360bbb-3(b)(1), unless the  authorization is terminated  or revoked sooner.       Influenza A by PCR NEGATIVE NEGATIVE Final   Influenza B by PCR NEGATIVE NEGATIVE Final    Comment: (NOTE) The Xpert Xpress SARS-CoV-2/FLU/RSV plus assay is intended as an aid in the diagnosis of influenza from Nasopharyngeal swab specimens and should not be used as a sole basis for treatment. Nasal washings and aspirates are unacceptable for Xpert Xpress SARS-CoV-2/FLU/RSV testing.  Fact Sheet for Patients: EntrepreneurPulse.com.au  Fact Sheet for Healthcare Providers: IncredibleEmployment.be  This test is not yet approved or cleared by the [place] FDA and has been authorized for detection and/or diagnosis of SARS-CoV-2 by FDA under an Emergency Use Authorization (EUA). This EUA will remain in effect (meaning this test can be used) for the duration of the COVID-19 declaration under Section 564(b)(1) of the Act, 21 U.S.C. section 360bbb-3(b)(1), unless the authorization is terminated or revoked.  Performed at [facility], [place] [address] [place], [place] [address]      Labs: Basic Metabolic Panel: Recent Labs  Lab 10/01/21 0916 10/01/21 1230 10/02/21 0319 10/03/21 0347  NA 139  --  135 137  K 3.3*  --  4.4 3.8  CL 104  --  102 101  CO2 28  --  25 27  GLUCOSE 117*  --  150* 131*  BUN 10  --  21 20  CREATININE 0.79  --  0.93 0.89  CALCIUM 9.4  --  9.1 9.7  MG  --  2.1  --  2.4  PHOS  --  2.7  --   --    Liver Function Tests: Recent Labs  Lab 10/01/21 0916  AST 16  ALT 17  ALKPHOS 54  BILITOT 1.2  PROT 7.1  ALBUMIN 3.9   No results for input(s): LIPASE, AMYLASE in the last 168 hours. No results for input(s): AMMONIA in the last 168 hours. CBC: Recent Labs  Lab 10/01/21 0916 10/03/21 0347  WBC 8.1 12.9*  NEUTROABS 4.7  --   HGB 14.1 12.8  HCT 40.4 35.7*  MCV 83.5 82.4  PLT 282 259   Cardiac Enzymes: No results for input(s):  CKTOTAL, CKMB, CKMBINDEX, TROPONINI in the last 168 hours. BNP: BNP (last 3 results) Recent Labs    08/12/21 1704  BNP 66.7    ProBNP (last 3 results) No results for input(s): PROBNP in the last 8760 hours.  CBG: No results for input(s): GLUCAP in the last 168 hours.     Signed:  Irine Seal MD.  [facility] 10/03/2021, 4:10 PM

## 2021-10-03 NOTE — Progress Notes (Signed)
Physical Therapy ambulated pt in the afternoon. physical therapy notified RN that pt HR went up to 160s once but went down immediately. However, PT's note was unclear thus MD asked RN to walk pt again and checked HR and O2 sat.  1645 pt ambulated by RN HR remained in 110s, got up to low 120s and returned back to 110s within a few seconds. O2 sat remained between 94-97%. RN noted that pt gets easily fatigued, pt was advised to take it slow.

## 2021-10-03 NOTE — TOC Initial Note (Addendum)
Transition of Care [facility]) - Initial/Assessment Note    Patient Details  Name: Helen Vasquez MRN: [id number] Date of Birth: 03/13/1956  Transition of [facility]) CM/SW Contact:    Lanier Clam, RN Phone Number: 10/03/2021, 1:02 PM  Clinical Narrative:confirmed [facility];CSW Briana Wiley;PT/OT no f/u. Not on home 02 prior this admission.Will need to receive  home 02 orders, & 02 sats documented to fax to:pcp-fax#[phone];& home 02-fax#[phone];VA process-family comes to [place] [facility] to get home 02 travel tank,then bring to hospital for patient. Continue to follow.                 -1:37p-spoke to dtr Sonya-she understands if home 02 needed-whe will get travel tank if VA approves it. -3p-patient states she declines to use her VA benefit while in hospital;Will inform VA, & admit dept.  Expected Discharge Plan: Home/Self Care Barriers to Discharge: Continued Medical Work up   Patient Goals and CMS Choice Patient states their goals for this hospitalization and ongoing recovery are:: Return home CMS Medicare.gov Compare Post Acute Care list provided to:: Patient Choice offered to / list presented to : Patient  Expected Discharge Plan and Services Expected Discharge Plan: Home/Self Care   Discharge Planning Services: CM Consult Post Acute Care Choice:  (home) Living arrangements for the past 2 months: Single Family Home                                      Prior Living Arrangements/Services Living arrangements for the past 2 months: Single Family Home Lives with:: Self   Do you feel safe going back to the place where you live?: Yes               Activities of Daily Living Home Assistive Devices/Equipment: None ADL Screening (condition at time of admission) Patient's cognitive ability adequate to safely complete daily activities?: Yes Is the patient deaf or have difficulty hearing?: No Does the patient have difficulty seeing, even when wearing  glasses/contacts?: No Does the patient have difficulty concentrating, remembering, or making decisions?: No Patient able to express need for assistance with ADLs?: Yes Does the patient have difficulty dressing or bathing?: No Independently performs ADLs?: Yes (appropriate for developmental age) Does the patient have difficulty walking or climbing stairs?: No Weakness of Legs: None Weakness of Arms/Hands: None  Permission Sought/Granted Permission sought to share information with : Case Manager Permission granted to share information with : Yes, Verbal Permission Granted  Share Information with NAME: Case manager           Emotional Assessment              Admission diagnosis:  COPD exacerbation (HCC) [J44.1] Acute respiratory failure with hypoxia (HCC) [J96.01] Patient Active Problem List   Diagnosis Date Noted   Acute respiratory failure with hypoxia (HCC) 10/01/2021   Hypertension 10/01/2021   Abdominal bloating 10/01/2021   Lactic acidosis 09/13/2021   Hypokalemia 09/13/2021   Acute exacerbation of chronic obstructive pulmonary disease (COPD) (HCC) 09/12/2021   PCP:  [facility] Pharmacy:  No Pharmacies Listed    Social Determinants of Health (SDOH) Interventions    Readmission Risk Interventions No flowsheet data found.

## 2021-10-03 NOTE — Evaluation (Signed)
Physical Therapy Evaluation Patient Details Name: Helen Vasquez MRN: [id number] DOB: 01/31/1956 Today's Date: 10/03/2021  History of Present Illness  Patient is a 66 year old female admitted with progressive worsening dyspnea with associated wheezing, nonproductive cough with no improvement on albuterol inhaler. Patient admitted for acute COPD exacerbation. PMH: asthma, COPD, seasonal allergies, hypertension, obesity, vocal cord dysfunction   Clinical Impression  Pt admitted with above diagnosis. Pt from home alone, reports gets SOB but powers through on a regular basis, denies falls, ind with ADLs/IADLs. Pt ambulates pushing IV pole around obstacles in hallway, dyspnea 3/4 noted on RA with SpO2 96-97%, HR up to 163 noted and returns to 110 with seated rest- RN notified. Pt requires seated rest break while ambulating then able to continue ambulation back to room. Pt with daughter locally, but typically doesn't need any assist and is independent. Educated pt on safety with mobility, time OOB and ambluating in hallway with nursing as able and pt verbalizes agreement. Pt currently with functional limitations due to the deficits listed below (see PT Problem List). Pt will benefit from skilled PT to increase their independence and safety with mobility to allow discharge to the venue listed below.          Recommendations for follow up therapy are one component of a multi-disciplinary discharge planning process, led by the attending physician.  Recommendations may be updated based on patient status, additional functional criteria and insurance authorization.  Follow Up Recommendations No PT follow up    Assistance Recommended at Discharge PRN  Patient can return home with the following       Equipment Recommendations None recommended by PT  Recommendations for Other Services       Functional Status Assessment Patient has had a recent decline in their functional status and demonstrates the ability  to make significant improvements in function in a reasonable and predictable amount of time.     Precautions / Restrictions Precautions Precautions: Other (comment) Precaution Comments: monitor HR Restrictions Weight Bearing Restrictions: No      Mobility  Bed Mobility Overal bed mobility: Independent   Transfers Overall transfer level: Independent Equipment used: None     Ambulation/Gait Ambulation/Gait assistance: Supervision Gait Distance (Feet): 150 Feet Assistive device: IV Pole Gait Pattern/deviations: Step-through pattern, Decreased stride length Gait velocity: slightly decreased     General Gait Details: step through pattern pushing IV pole, dyspnea 3/4 and SpO2 96-97% on RA, HR up to 163 max noted, down to 110 once seated resting  Stairs            Wheelchair Mobility    Modified Rankin (Stroke Patients Only)       Balance Overall balance assessment: No apparent balance deficits (not formally assessed)        Pertinent Vitals/Pain Pain Assessment Pain Assessment: No/denies pain    Home Living Family/patient expects to be discharged to:: Private residence Living Arrangements: Alone Available Help at Discharge: Family (daughter lives on other side of [place]) Type of Home: Apartment Home Access: Elevator       Home Layout: One level Home Equipment: None      Prior Function Prior Level of Function : Independent/Modified Independent  Mobility Comments: pt reports ind with community ambulation, gets SOB but powers through, denies falls ADLs Comments: pt reports ind with ADLs/IADLs     Hand Dominance   Dominant Hand: Left    Extremity/Trunk Assessment   Upper Extremity Assessment Upper Extremity Assessment: Defer to OT evaluation  Lower Extremity Assessment Lower Extremity Assessment: Overall WFL for tasks assessed    Cervical / Trunk Assessment Cervical / Trunk Assessment: Normal  Communication   Communication: No  difficulties  Cognition Arousal/Alertness: Awake/alert Behavior During Therapy: WFL for tasks assessed/performed Overall Cognitive Status: Within Functional Limits for tasks assessed           General Comments General comments (skin integrity, edema, etc.): HR 163 max noted, down to 110 at EOS with seated rest    Exercises     Assessment/Plan    PT Assessment Patient needs continued PT services  PT Problem List Decreased activity tolerance;Cardiopulmonary status limiting activity;Obesity       PT Treatment Interventions DME instruction;Gait training;Functional mobility training;Therapeutic activities;Therapeutic exercise;Balance training;Neuromuscular re-education    PT Goals (Current goals can be found in the Care Plan section)  Acute Rehab PT Goals Patient Stated Goal: return home PT Goal Formulation: With patient Time For Goal Achievement: 10/17/21 Potential to Achieve Goals: Good    Frequency Min 3X/week     Co-evaluation               AM-PAC PT "6 Clicks" Mobility  Outcome Measure Help needed turning from your back to your side while in a flat bed without using bedrails?: None Help needed moving from lying on your back to sitting on the side of a flat bed without using bedrails?: None Help needed moving to and from a bed to a chair (including a wheelchair)?: None Help needed standing up from a chair using your arms (e.g., wheelchair or bedside chair)?: None Help needed to walk in hospital room?: A Little Help needed climbing 3-5 steps with a railing? : A Little 6 Click Score: 22    End of Session   Activity Tolerance: Patient tolerated treatment well Patient left: in bed;with call bell/phone within reach Nurse Communication: Mobility status;Other (comment) (HR and SpO2) PT Visit Diagnosis: Other abnormalities of gait and mobility (R26.89)    Time: 3267-1245 PT Time Calculation (min) (ACUTE ONLY): 20 min   Charges:   PT Evaluation $PT Eval Low  Complexity: 1 Low          Tori Lynasia Meloche PT, DPT 10/03/21, 12:54 PM

## 2021-10-23 ENCOUNTER — Other Ambulatory Visit: Payer: Self-pay

## 2021-10-23 ENCOUNTER — Encounter: Payer: Self-pay | Admitting: Pulmonary Disease

## 2021-10-23 ENCOUNTER — Ambulatory Visit (INDEPENDENT_AMBULATORY_CARE_PROVIDER_SITE_OTHER): Payer: No Typology Code available for payment source | Admitting: Pulmonary Disease

## 2021-10-23 VITALS — BP 138/90 | HR 96 | Ht 63.5 in | Wt 218.6 lb

## 2021-10-23 DIAGNOSIS — J45909 Unspecified asthma, uncomplicated: Secondary | ICD-10-CM | POA: Insufficient documentation

## 2021-10-23 DIAGNOSIS — J449 Chronic obstructive pulmonary disease, unspecified: Secondary | ICD-10-CM

## 2021-10-23 MED ORDER — BUDESONIDE-FORMOTEROL FUMARATE 160-4.5 MCG/ACT IN AERO: 2.0000 | INHALATION_SPRAY | Freq: Two times a day (BID) | RESPIRATORY_TRACT | 5 refills | Status: DC

## 2021-10-23 MED ORDER — ALBUTEROL SULFATE HFA 108 (90 BASE) MCG/ACT IN AERS: 2.0000 | INHALATION_SPRAY | Freq: Four times a day (QID) | RESPIRATORY_TRACT | 5 refills | Status: DC | PRN

## 2021-10-23 MED ORDER — MONTELUKAST SODIUM 10 MG PO TABS: 10.0000 mg | ORAL_TABLET | Freq: Every day | ORAL | 3 refills | Status: DC

## 2021-10-23 NOTE — Patient Instructions (Addendum)
COPD-asthma overlap --START Symbicort 160-4.5 mcg TWO puffs TWICE a day. This is your EVERYDAY inhaler --CONTINUE Albuterol AS NEEDED for shortness or wheezing. This is your RESCUE inhaler during the day --CONTINUE singulair 10 mg ONCE a day. REFILL --ORDER nebulizer --ARRANGE pulmonary function tests   Follow-up with me in March or April with PFTs prior to visit

## 2021-10-23 NOTE — Progress Notes (Signed)
Subjective:   PATIENT ID: Orvilla Fus GENDER: female DOB: April 19, 1956, MRN: [id number]   HPI  Chief Complaint  Patient presents with   Consult    SOB about 91mo   Reason for Visit: New consult for shortness of breath  Ms. Yaniah Norfleet is a 66 year old female with childhood asthma, presumed COPD, hypertension who presents as a new consult.  She was recently hospitalized in December and January for COPD exacerbation with new O2 requirement.  She was treated with steroids nebulizers and antibiotics.  On 10/03/2021 discharge summary which was reviewed, she was discharged on room air with plan for outpatient follow-up.  [facility] Dr[facility] Chilton Si on 09/24/21. She was started on Wixela. No PFTs in record.  Since discharge she has not been been taking Symbicort due to concern for the steroids. She reports shortness of breath with exertion. She can walk a block but usually has back issues limits her rather than breathing. Denies wheezing or chronic cough. She quit smoking 4 months ago. She had PFTs completed 2 months. At home she reports SpO2 94%. Her symptoms usually worsen after caring for grandchild. She reports occasional muscle tightness.  Social History: Childhood asthma Former 22 pack-years. Quit 06/2021.  Environmental exposures:  Games developer  I have personally reviewed patient's past medical/family/social history, allergies, current medications.  Past Medical History:  Diagnosis Date   Asthma    COPD (chronic obstructive pulmonary disease) (HCC)    High blood pressure      Family History  Problem Relation Age of Onset   Hypertension Other      Social History   Occupational History   Not on file  Tobacco Use   Smoking status: Former    Packs/day: 0.50    Years: 43.00    Pack years: 21.50    Types: Cigarettes   Smokeless tobacco: Never   Tobacco comments:    States she smoked off and on from 1979 until 2022  Substance and Sexual Activity    Alcohol use: Never   Drug use: Never   Sexual activity: Not on file    Allergies  Allergen Reactions   Bee Venom Anaphylaxis and Hives   Ace Inhibitors Swelling    Reaction(s): Unknown   Penicillin G Hives     Outpatient Medications Prior to Visit  Medication Sig Dispense Refill   aspirin EC 81 MG tablet Take 81 mg by mouth daily. Swallow whole.     cholecalciferol (VITAMIN D3) 25 MCG (1000 UNIT) tablet Take 1,000 Units by mouth daily.     fluticasone (FLONASE) 50 MCG/ACT nasal spray Place 2 sprays into both nostrils daily. 11.1 mL 0   guaifenesin (HUMIBID E) 400 MG TABS tablet Take 400 mg by mouth every 6 (six) hours as needed (cough).     loratadine (CLARITIN) 10 MG tablet Take 1 tablet (10 mg total) by mouth daily. 30 tablet 1   losartan-hydrochlorothiazide (HYZAAR) 100-25 MG tablet Take 1 tablet by mouth daily.     montelukast (SINGULAIR) 10 MG tablet Take 1 tablet (10 mg total) by mouth at bedtime. 30 tablet 1   Omega-3 1000 MG CAPS Take 1,000 mg by mouth daily.     pantoprazole (PROTONIX) 40 MG tablet Take 1 tablet (40 mg total) by mouth daily at 6 (six) AM. 30 tablet 1   polyethylene glycol (MIRALAX / GLYCOLAX) 17 g packet Take 17 g by mouth daily. 30 each 0   senna-docusate (SENOKOT-S) 8.6-50 MG tablet Take  1 tablet by mouth 2 (two) times daily.     acetaminophen (TYLENOL) 325 MG tablet Take 2 tablets (650 mg total) by mouth every 6 (six) hours as needed for mild pain (or Fever >/= 101). (Patient not taking: Reported on 10/23/2021)     albuterol (PROVENTIL) (2.5 MG/3ML) 0.083% nebulizer solution Take 3 mLs (2.5 mg total) by nebulization every 4 (four) hours as needed for wheezing or shortness of breath. Use 3 times daily x5 days, then every 4 hours as needed. (Patient not taking: Reported on 10/23/2021) 125 mL 1   albuterol (VENTOLIN HFA) 108 (90 Base) MCG/ACT inhaler Inhale 2 puffs into the lungs every 6 (six) hours as needed for wheezing or shortness of breath. (Patient not taking:  Reported on 10/23/2021) 8 g 2   budesonide-formoterol (SYMBICORT) 160-4.5 MCG/ACT inhaler Inhale 2 puffs into the lungs in the morning and at bedtime. (Patient not taking: Reported on 10/23/2021) 6 g 1   simethicone (MYLICON) 80 MG chewable tablet Chew 2 tablets (160 mg total) by mouth 4 (four) times daily for 7 days. 30 tablet 0   No facility-administered medications prior to visit.    Review of Systems  Constitutional:  Negative for chills, diaphoresis, fever, malaise/fatigue and weight loss.  HENT:  Negative for congestion, ear pain and sore throat.   Respiratory:  Positive for shortness of breath. Negative for cough, hemoptysis, sputum production and wheezing.   Cardiovascular:  Positive for palpitations. Negative for chest pain and leg swelling.  Gastrointestinal:  Negative for abdominal pain, heartburn and nausea.  Genitourinary:  Negative for frequency.  Musculoskeletal:  Positive for joint pain. Negative for myalgias.  Skin:  Negative for itching and rash.  Neurological:  Negative for dizziness, weakness and headaches.  Endo/Heme/Allergies:  Does not bruise/bleed easily.  Psychiatric/Behavioral:  Negative for depression. The patient is nervous/anxious.     Objective:   Vitals:   10/23/21 1008  BP: 138/90  Pulse: 96  SpO2: 97%  Weight: 218 lb 9.6 oz (99.2 kg)  Height: 5' 3.5" (1.613 m)   SpO2: 97 % O2 Device: None (Room air)  Physical Exam: General: Well-appearing, no acute distress HENT: [place], AT Eyes: EOMI, no scleral icterus Respiratory: Clear to auscultation bilaterally.  No crackles, wheezing or rales Cardiovascular: RRR, -M/R/G, no JVD Extremities:-Edema,-tenderness Neuro: AAO x4, CNII-XII grossly intact Psych: Normal mood, normal affect  Data Reviewed:  Imaging: CT chest 09/12/2021-bibasilar atelectasis/scarring.  Left upper lobe calcified granuloma.  Mild centrilobular emphysema in upper lobes bilaterally.  Prominent pulmonary trunk suggestive of arterial  hypertension.  PFT: None on file  Labs: CBC    Component Value Date/Time   WBC 12.9 (H) 10/03/2021 0347   RBC 4.33 10/03/2021 0347   HGB 12.8 10/03/2021 0347   HCT 35.7 (L) 10/03/2021 0347   PLT 259 10/03/2021 0347   MCV 82.4 10/03/2021 0347   MCH 29.6 10/03/2021 0347   MCHC 35.9 10/03/2021 0347   RDW 14.8 10/03/2021 0347   LYMPHSABS 2.1 10/01/2021 0916   MONOABS 0.6 10/01/2021 0916   EOSABS 0.6 (H) 10/01/2021 0916   BASOSABS 0.0 10/01/2021 0916   Absolute eos 10/01/21 -600     Assessment & Plan:   Discussion: 66 year old female former smoker with childhood asthma, presumed COPD, hypertension who presents with uncontrolled COPD and recent hospitalizations x 2. Peripheral eosiniphil levels suggest potential benefit from ICS/LABA inhaler. Ambulatory O2 with no desaturations. SpO2 nadir 93%. Discussed clinical course and management of COPD/asthma including bronchodilator regimen and action plan for exacerbation.  COPD-asthma overlap - uncontrolled. --START Symbicort 160-4.5 mcg TWO puffs TWICE a day. This is your EVERYDAY inhaler --CONTINUE Albuterol AS NEEDED for shortness or wheezing. This is your RESCUE inhaler during the day --CONTINUE singulair 10 mg ONCE a day. REFILL --ORDER nebulizer --ARRANGE pulmonary function tests  Health Maintenance Immunization History  Administered Date(s) Administered   Td 12/15/2007   CT Lung Screen - Refer at next visit  Orders Placed This Encounter  Procedures   Ambulatory Referral for DME    Referral Priority:   Routine    Referral Type:   Durable Medical Equipment Purchase    Number of Visits Requested:   1   Pulmonary function test    Standing Status:   Future    Standing Expiration Date:   10/23/2022    Order Specific Question:   Where should this test be performed?    Answer:    Pulmonary    Order Specific Question:   Full PFT: includes the following: basic spirometry, spirometry pre & post bronchodilator, diffusion  capacity (DLCO), lung volumes    Answer:   Full PFT   Meds ordered this encounter  Medications   budesonide-formoterol (SYMBICORT) 160-4.5 MCG/ACT inhaler    Sig: Inhale 2 puffs into the lungs in the morning and at bedtime.    Dispense:  10.2 g    Refill:  5   albuterol (VENTOLIN HFA) 108 (90 Base) MCG/ACT inhaler    Sig: Inhale 2 puffs into the lungs every 6 (six) hours as needed for wheezing or shortness of breath.    Dispense:  8 g    Refill:  5   montelukast (SINGULAIR) 10 MG tablet    Sig: Take 1 tablet (10 mg total) by mouth at bedtime.    Dispense:  90 tablet    Refill:  3    Return in about 1 month (around 11/20/2021).  I have spent a total time of 45-minutes on the day of the appointment reviewing prior documentation, coordinating care and discussing medical diagnosis and plan with the patient/family. Imaging, labs and tests included in this note have been reviewed and interpreted independently by me.  Paisyn Guercio Mechele Collin, MD  Pulmonary Critical Care 10/23/2021 10:11 AM  Office Number [phone]

## 2021-10-29 ENCOUNTER — Emergency Department (HOSPITAL_COMMUNITY): Payer: No Typology Code available for payment source

## 2021-10-29 ENCOUNTER — Other Ambulatory Visit: Payer: Self-pay

## 2021-10-29 ENCOUNTER — Emergency Department (HOSPITAL_COMMUNITY)
Admission: EM | Admit: 2021-10-29 | Discharge: 2021-10-29 | Disposition: A | Discharge status: Home / Self Care | Payer: No Typology Code available for payment source | Attending: Emergency Medicine | Admitting: Emergency Medicine

## 2021-10-29 ENCOUNTER — Ambulatory Visit: Payer: Self-pay | Admitting: *Deleted

## 2021-10-29 DIAGNOSIS — I10 Essential (primary) hypertension: Secondary | ICD-10-CM | POA: Insufficient documentation

## 2021-10-29 DIAGNOSIS — J449 Chronic obstructive pulmonary disease, unspecified: Secondary | ICD-10-CM | POA: Insufficient documentation

## 2021-10-29 DIAGNOSIS — J45909 Unspecified asthma, uncomplicated: Secondary | ICD-10-CM | POA: Diagnosis not present

## 2021-10-29 DIAGNOSIS — Z79899 Other long term (current) drug therapy: Secondary | ICD-10-CM | POA: Insufficient documentation

## 2021-10-29 DIAGNOSIS — R079 Chest pain, unspecified: Secondary | ICD-10-CM | POA: Diagnosis not present

## 2021-10-29 DIAGNOSIS — R600 Localized edema: Secondary | ICD-10-CM

## 2021-10-29 DIAGNOSIS — Z7982 Long term (current) use of aspirin: Secondary | ICD-10-CM | POA: Diagnosis not present

## 2021-10-29 DIAGNOSIS — R0602 Shortness of breath: Secondary | ICD-10-CM

## 2021-10-29 LAB — COMPREHENSIVE METABOLIC PANEL
ALT: 12 U/L (ref 0–44)
AST: 17 U/L (ref 15–41)
Albumin: 3.3 g/dL — ABNORMAL LOW (ref 3.5–5.0)
Alkaline Phosphatase: 53 U/L (ref 38–126)
Anion gap: 8 (ref 5–15)
BUN: 12 mg/dL (ref 8–23)
CO2: 31 mmol/L (ref 22–32)
Calcium: 10 mg/dL (ref 8.9–10.3)
Chloride: 100 mmol/L (ref 98–111)
Creatinine, Ser: 1.01 mg/dL — ABNORMAL HIGH (ref 0.44–1.00)
GFR, Estimated: >60 mL/min (ref >60)
Glucose, Bld: 98 mg/dL (ref 70–99)
Potassium: 3.7 mmol/L (ref 3.5–5.1)
Sodium: 139 mmol/L (ref 135–145)
Total Bilirubin: 0.6 mg/dL (ref 0.3–1.2)
Total Protein: 6.2 g/dL — ABNORMAL LOW (ref 6.5–8.1)

## 2021-10-29 LAB — CBC WITH DIFFERENTIAL/PLATELET
Abs Immature Granulocytes: 0.02 10*3/uL (ref 0.00–0.07)
Basophils Absolute: 0 10*3/uL (ref 0.0–0.1)
Basophils Relative: 1 %
Eosinophils Absolute: 0.4 10*3/uL (ref 0.0–0.5)
Eosinophils Relative: 6 %
HCT: 35.5 % — ABNORMAL LOW (ref 36.0–46.0)
Hemoglobin: 12.1 g/dL (ref 12.0–15.0)
Immature Granulocytes: 0 %
Lymphocytes Relative: 27 %
Lymphs Abs: 1.8 10*3/uL (ref 0.7–4.0)
MCH: 28.8 pg (ref 26.0–34.0)
MCHC: 34.1 g/dL (ref 30.0–36.0)
MCV: 84.5 fL (ref 80.0–100.0)
Monocytes Absolute: 0.7 10*3/uL (ref 0.1–1.0)
Monocytes Relative: 10 %
Neutro Abs: 3.8 10*3/uL (ref 1.7–7.7)
Neutrophils Relative %: 56 %
Platelets: 292 10*3/uL (ref 150–400)
RBC: 4.2 MIL/uL (ref 3.87–5.11)
RDW: 14.3 % (ref 11.5–15.5)
WBC: 6.7 10*3/uL (ref 4.0–10.5)
nRBC: 0 % (ref 0.0–0.2)

## 2021-10-29 LAB — TROPONIN I (HIGH SENSITIVITY)
Troponin I (High Sensitivity): 10 ng/L (ref <18)
Troponin I (High Sensitivity): 13 ng/L (ref <18)

## 2021-10-29 LAB — BRAIN NATRIURETIC PEPTIDE: B Natriuretic Peptide: 26.1 pg/mL (ref 0.0–100.0)

## 2021-10-29 MED ORDER — IOHEXOL 350 MG/ML SOLN
100.0000 mL | Freq: Once | INTRAVENOUS | Status: AC | PRN
Start: 2021-10-29 — End: 2021-10-29
  Administered 2021-10-29: 100 mL via INTRAVENOUS

## 2021-10-29 MED ORDER — IPRATROPIUM-ALBUTEROL 0.5-2.5 (3) MG/3ML IN SOLN
3.0000 mL | Freq: Once | RESPIRATORY_TRACT | Status: AC
  Administered 2021-10-29: 3 mL via RESPIRATORY_TRACT
  Filled 2021-10-29: qty 3

## 2021-10-29 NOTE — Discharge Instructions (Addendum)
Your workup today was reassuring.  Your heart is functioning well.  No evidence of blood clot in your lungs.  No signs of pneumonia.  I would like for you to follow-up with your primary care doctor for further evaluation.  He return to the emergency department for worsening symptoms.

## 2021-10-29 NOTE — ED Provider Notes (Signed)
[facility] on [facility] EMERGENCY DEPARTMENT Provider Note   CSN: [id number] Arrival date & time: 10/29/21  1556     History  Chief Complaint  Patient presents with   Shortness of Breath   Leg Swelling    Robert Sunga is a 66 y.o. female with the below diagnoses who presents to the emergency department with exertion, lower leg swelling, and chest pain that has been ongoing for the last 4 days. Patient also mentions she has been having orthopnea for the last year requiring her to sleep with numerous pillows. Denies any history of CHF.  Patient primarily gets her care to the [place].  I am unable to see the records.  Her most recent admission was last month for COPD exacerbation.  Patient not currently on at home oxygen.  Nor does she take any diuretics apart from hydrochlorothiazide.  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 Authorizing Provider  acetaminophen (TYLENOL) 325 MG tablet Take 2 tablets (650 mg total) by mouth every 6 (six) hours as needed for mild pain (or Fever >/= 101). Patient not taking: Reported on 10/23/2021 10/03/21   Rodolph Bong, MD  albuterol (PROVENTIL) (2.5 MG/3ML) 0.083% nebulizer solution Take 3 mLs (2.5 mg total) by nebulization every 4 (four) hours as needed for wheezing or shortness of breath. Use 3 times daily x5 days, then every 4 hours as needed. Patient not taking: Reported on 10/23/2021 10/03/21   Rodolph Bong, MD  albuterol (VENTOLIN HFA) 108 (90 Base) MCG/ACT inhaler Inhale 2 puffs into the lungs every 6 (six) hours as needed for wheezing or shortness of breath. 10/23/21   Luciano Cutter, MD  aspirin EC 81 MG tablet Take 81 mg by mouth daily. Swallow whole.    [provider]   budesonide-formoterol (SYMBICORT) 160-4.5 MCG/ACT inhaler Inhale 2 puffs into the lungs in the morning and at bedtime. 10/23/21   Luciano Cutter, MD  cholecalciferol (VITAMIN D3) 25 MCG (1000 UNIT) tablet Take 1,000 Units by mouth daily.    [provider]  fluticasone (FLONASE) 50 MCG/ACT nasal spray Place 2 sprays into both nostrils daily. 10/04/21   Rodolph Bong, MD  guaifenesin (HUMIBID E) 400 MG TABS tablet Take 400 mg by mouth every 6 (six) hours as needed (cough).    [provider]  loratadine (CLARITIN) 10 MG tablet Take 1 tablet (10 mg total) by mouth daily. 10/04/21   Rodolph Bong, MD  losartan-hydrochlorothiazide (HYZAAR) 100-25 MG tablet Take 1 tablet by mouth daily. 09/10/21   [provider]  montelukast (SINGULAIR) 10 MG tablet Take 1 tablet (10 mg total) by mouth at bedtime. 10/23/21   Luciano Cutter, MD  Omega-3 1000 MG CAPS Take 1,000 mg by mouth daily.    [provider]  pantoprazole (PROTONIX) 40 MG tablet Take 1 tablet (40 mg total) by mouth daily at 6 (six) AM. 10/04/21   Rodolph Bong, MD  polyethylene glycol (MIRALAX / GLYCOLAX) 17 g packet Take 17 g by mouth daily. 10/03/21   Rodolph Bong, MD  senna-docusate (SENOKOT-S) 8.6-50 MG tablet Take 1 tablet by mouth 2 (two) times daily. 10/03/21   Rodolph Bong, MD  simethicone (MYLICON) 80 MG  chewable tablet Chew 2 tablets (160 mg total) by mouth 4 (four) times daily for 7 days. 10/03/21 10/10/21  Rodolph Bong, MD      Allergies    Bee venom, Ace inhibitors, and Penicillin g    Review of Systems   Review of Systems  Respiratory:  Positive for shortness of breath.    Physical Exam Updated Vital Signs BP (!) 172/86    Pulse (!) 111    Temp 98.1 F (36.7 C) (Oral)    Resp 18    Ht 5' 3.5" (1.613 m)    Wt 98.9 kg    SpO2 94%    BMI 38.01 kg/m  Physical Exam Vitals and nursing note reviewed.  Constitutional:      General: She is not in acute distress.     Appearance: Normal appearance.  HENT:     Head: Normocephalic and atraumatic.  Eyes:     General:        Right eye: No discharge.        Left eye: No discharge.  Cardiovascular:     Comments: Regular rate and rhythm.  S1/S2 are distinct without any evidence of murmur, rubs, or gallops.  Radial pulses are 2+ bilaterally.  Dorsalis pedis pulses are 2+ bilaterally.   Pulmonary:     Comments: Shallow breath sounds.  No respiratory distress.  No evidence of wheezes, rales, or rhonchi heard throughout. Abdominal:     General: Abdomen is flat. Bowel sounds are normal. There is no distension.     Tenderness: There is no abdominal tenderness. There is no guarding or rebound.  Musculoskeletal:        General: Normal range of motion.     Cervical back: Neck supple.     Right lower leg: 1+ Pitting Edema present.     Left lower leg: 1+ Pitting Edema present.  Skin:    General: Skin is warm and dry.     Findings: No rash.  Neurological:     General: No focal deficit present.     Mental Status: She is alert.  Psychiatric:        Mood and Affect: Mood normal.        Behavior: Behavior normal.    ED Results / Procedures / Treatments   Labs (all labs ordered are listed, but only abnormal results are displayed) Labs Reviewed  COMPREHENSIVE METABOLIC PANEL - Abnormal; Notable for the following components:      Result Value   Creatinine, Ser 1.01 (*)    Total Protein 6.2 (*)    Albumin 3.3 (*)    All other components within normal limits  CBC WITH DIFFERENTIAL/PLATELET - Abnormal; Notable for the following components:   HCT 35.5 (*)    All other components within normal limits  BRAIN NATRIURETIC PEPTIDE  TROPONIN I (HIGH SENSITIVITY)  TROPONIN I (HIGH SENSITIVITY)    EKG None  Radiology DG Chest 2 View  Result Date: 10/29/2021 CLINICAL DATA:  Chest pain, shortness of breath, lower extremity edema for 2 months EXAM: CHEST - 2 VIEW COMPARISON:  10/01/2021 FINDINGS: Frontal and lateral  views of the chest demonstrate a stable cardiac silhouette. Continued ectasia of the thoracic aorta. Prominence of the central pulmonary vasculature. No airspace disease, effusion, or pneumothorax. No acute bony abnormalities. IMPRESSION: 1. Stable chest, no acute process. Electronically Signed   By: Sharlet Salina M.D.   On: 10/29/2021 17:17   CT Angio Chest PE W and/or Wo Contrast  Result Date:  10/29/2021 CLINICAL DATA:  Dyspnea on exertion (DOE) shortness of breath EXAM: CT ANGIOGRAPHY CHEST WITH CONTRAST TECHNIQUE: Multidetector CT imaging of the chest was performed using the standard protocol during bolus administration of intravenous contrast. Multiplanar CT image reconstructions and MIPs were obtained to evaluate the vascular anatomy. RADIATION DOSE REDUCTION: This exam was performed according to the departmental dose-optimization program which includes automated exposure control, adjustment of the mA and/or kV according to patient size and/or use of iterative reconstruction technique. CONTRAST:  100mL OMNIPAQUE IOHEXOL 350 MG/ML SOLN COMPARISON:  09/12/2021 FINDINGS: Cardiovascular: No filling defects in the pulmonary arteries to suggest pulmonary emboli. Heart borderline in size. Aorta normal caliber. Mediastinum/Nodes: No mediastinal, hilar, or axillary adenopathy. Trachea and esophagus are unremarkable. Thyroid unremarkable. Lungs/Pleura: Linear areas of atelectasis or scarring in the lung bases. No effusions. No confluent opacities. Upper Abdomen: Imaging into the upper abdomen demonstrates no acute findings. Musculoskeletal: Chest wall soft tissues are unremarkable. No acute bony abnormality. Review of the MIP images confirms the above findings. IMPRESSION: No evidence of pulmonary embolus. Bibasilar atelectasis or scarring. Electronically Signed   By: Charlett NoseKevin  Dover M.D.   On: 10/29/2021 20:15    Procedures Procedures  Cardiac telemetry revealed normal sinus rhythm.  She is slightly hypertensive.   Oxygenating well on room air.  Medications Ordered in ED Medications  ipratropium-albuterol (DUONEB) 0.5-2.5 (3) MG/3ML nebulizer solution 3 mL (3 mLs Nebulization Given 10/29/21 1801)  iohexol (OMNIPAQUE) 350 MG/ML injection 100 mL (100 mLs Intravenous Contrast Given 10/29/21 2011)    ED Course/ Medical Decision Making/ A&P                           Medical Decision Making Amount and/or Complexity of Data Reviewed Labs: ordered. Radiology: ordered.  Risk Prescription drug management.   Orvilla FusJoyce Fernicola is a 66 y.o. female with history of COPD who presents to the emergency department with a 4-day history of shortness of breath, lower extremity edema, and chest pain.  Differential diagnosis includes new onset CHF, COPD exacerbation, possible ACS, pneumonia, dysrhythmia, and pulmonary embolism. Shortness of breath could also be from pulmonary edema.  I am unable to see whether or not she has had an echocardiogram.  I do not see a history of CHF.  Patient is not no respiratory distress at this time and she is able to answer all my questions without being profoundly short of breath.  I will plan to get labs to work-up for the above diagnoses and chest x-ray.  We will plan to reassess.  I personally reviewed the labs and imaging.  CBC was without any evidence of leukocytosis or anemia to explain her shortness of breath.  CMP was normal apart from slightly elevated creatinine.  BNP was negative.  I have a low suspicion for CHF.  Initial troponin was negative.  Delta troponin was negative.   On reevaluation, patient was having some "twinges" of chest pain.  We will get an additional EKG. Patient became tachycardic.  This is likely after DuoNeb.  Discussed case with Dr. Freida BusmanAllen my attending.  We will get a CT pulmonary embolism study to rule out pulmonary embolism.  I personally reviewed the CTPA which was negative.  I do agree with the radiologist or potation.  Patient is still having tachycardia which I  believe is likely due to DuoNeb.  Overall, patient is well-appearing in no acute distress.  She has been oxygenating well on room air.  Her tachypnea  has improved after DuoNeb.  I do believe she is safe the outpatient setting.  Strict turn precautions are given.  She is safe for discharge.  Final Clinical Impression(s) / ED Diagnoses Final diagnoses:  Bilateral lower extremity edema  Shortness of breath    Rx / DC Orders ED Discharge Orders     None         Jolyn Lent 10/29/21 2038    Lorre Nick, MD 10/29/21 2158

## 2021-10-29 NOTE — Telephone Encounter (Signed)
°  Chief Complaint: edema-feet/ankles Symptoms: edema, thick mucus in throat Frequency: 4 days Pertinent Negatives: Patient denies  pain,redness Disposition: [x] ED /[] Urgent Care (no appt availability in office) / [] Appointment(In office/virtual)/ []  [place] [facility]/ [] Home Care/ [] Refused Recommended Disposition /[] [place] [facility]/ []  Follow-up with PCP Additional Notes: Patient does not have established PCP- or transportation- advised EMS-ED for evaluation

## 2021-10-29 NOTE — ED Notes (Signed)
Pt transported to ct scan at this time.

## 2021-10-29 NOTE — Telephone Encounter (Signed)
Reason for Disposition  [1] Thigh, calf, or ankle swelling AND [2] bilateral AND [3] 1 side is more swollen  Answer Assessment - Initial Assessment Questions 1. LOCATION: "Which ankle is swollen?" "Where is the swelling?"     both ankles- left ankle is larger 2. ONSET: "When did the swelling start?"     4 days  3. SIZE: "How large is the swelling?"     Able to walk normally 4. PAIN: "Is there any pain?" If Yes, ask: "How bad is it?" (Scale 1-10; or mild, moderate, severe)   - NONE (0): no pain.   - MILD (1-3): doesn't interfere with normal activities.    - MODERATE (4-7): interferes with normal activities (e.g., work or school) or awakens from sleep, limping.    - SEVERE (8-10): excruciating pain, unable to do any normal activities, unable to walk.      No pain 5. CAUSE: "What do you think caused the ankle swelling?"     Unsure- years ago 6. OTHER SYMPTOMS: "Do you have any other symptoms?" (e.g., fever, chest pain, difficulty breathing, calf pain)     Mucus in throat- causes cough, COPD/asthma 7. PREGNANCY: "Is there any chance you are pregnant?" "When was your last menstrual period?"     *No Answer*  Protocols used: Ankle Swelling-A-AH

## 2021-10-29 NOTE — ED Notes (Signed)
Called SW to help pot get transportation back to her car

## 2021-10-29 NOTE — ED Notes (Signed)
Received verbal report from Gloria C RN at this time °

## 2021-10-29 NOTE — ED Notes (Signed)
Returned from ct and needed to use restroom. Pt ambulated to restroom without assistance and oxygen. While ambulating back from restroom and c/o mild SOB . Placed pt back in bed and back on monitor at this time

## 2021-10-29 NOTE — ED Triage Notes (Signed)
Pt BIB [facility], [facility] and intermittent shortness of breath on exertion, as well as 19lb weight gain in the last 2 months. 150/90 BP, 90HR, sinus rhythm. EMS placed pt on 2LPM per [place] for low sats. Pt does have swelling to BLE.

## 2021-11-20 ENCOUNTER — Other Ambulatory Visit: Payer: Self-pay

## 2021-11-20 ENCOUNTER — Encounter (HOSPITAL_COMMUNITY): Payer: Self-pay | Admitting: *Deleted

## 2021-11-20 ENCOUNTER — Inpatient Hospital Stay (HOSPITAL_COMMUNITY)
Admission: EM | Admit: 2021-11-20 | Discharge: 2021-11-22 | DRG: 190 | Disposition: A | Discharge status: Home Health | Payer: No Typology Code available for payment source | Attending: Family Medicine | Admitting: Family Medicine

## 2021-11-20 ENCOUNTER — Emergency Department (HOSPITAL_COMMUNITY): Payer: No Typology Code available for payment source

## 2021-11-20 DIAGNOSIS — Z20822 Contact with and (suspected) exposure to covid-19: Secondary | ICD-10-CM | POA: Diagnosis present

## 2021-11-20 DIAGNOSIS — Z7982 Long term (current) use of aspirin: Secondary | ICD-10-CM

## 2021-11-20 DIAGNOSIS — J45901 Unspecified asthma with (acute) exacerbation: Secondary | ICD-10-CM | POA: Diagnosis present

## 2021-11-20 DIAGNOSIS — Z91030 Bee allergy status: Secondary | ICD-10-CM

## 2021-11-20 DIAGNOSIS — Z87891 Personal history of nicotine dependence: Secondary | ICD-10-CM

## 2021-11-20 DIAGNOSIS — Z880 Allergy status to penicillin: Secondary | ICD-10-CM | POA: Diagnosis not present

## 2021-11-20 DIAGNOSIS — Z7951 Long term (current) use of inhaled steroids: Secondary | ICD-10-CM | POA: Diagnosis not present

## 2021-11-20 DIAGNOSIS — J45909 Unspecified asthma, uncomplicated: Secondary | ICD-10-CM

## 2021-11-20 DIAGNOSIS — Z8249 Family history of ischemic heart disease and other diseases of the circulatory system: Secondary | ICD-10-CM

## 2021-11-20 DIAGNOSIS — Z79899 Other long term (current) drug therapy: Secondary | ICD-10-CM | POA: Diagnosis not present

## 2021-11-20 DIAGNOSIS — J9601 Acute respiratory failure with hypoxia: Secondary | ICD-10-CM | POA: Diagnosis present

## 2021-11-20 DIAGNOSIS — R0609 Other forms of dyspnea: Secondary | ICD-10-CM | POA: Diagnosis not present

## 2021-11-20 DIAGNOSIS — R079 Chest pain, unspecified: Secondary | ICD-10-CM

## 2021-11-20 DIAGNOSIS — I10 Essential (primary) hypertension: Secondary | ICD-10-CM | POA: Diagnosis present

## 2021-11-20 DIAGNOSIS — I493 Ventricular premature depolarization: Secondary | ICD-10-CM | POA: Diagnosis not present

## 2021-11-20 DIAGNOSIS — R0602 Shortness of breath: Secondary | ICD-10-CM | POA: Diagnosis present

## 2021-11-20 DIAGNOSIS — N179 Acute kidney failure, unspecified: Secondary | ICD-10-CM | POA: Diagnosis not present

## 2021-11-20 DIAGNOSIS — J302 Other seasonal allergic rhinitis: Secondary | ICD-10-CM | POA: Insufficient documentation

## 2021-11-20 DIAGNOSIS — J441 Chronic obstructive pulmonary disease with (acute) exacerbation: Secondary | ICD-10-CM | POA: Diagnosis present

## 2021-11-20 LAB — I-STAT ARTERIAL BLOOD GAS, ED
Acid-Base Excess: 1 mmol/L (ref 0.0–2.0)
Bicarbonate: 26.1 mmol/L (ref 20.0–28.0)
Calcium, Ion: 1.27 mmol/L (ref 1.15–1.40)
HCT: 39 % (ref 36.0–46.0)
Hemoglobin: 13.3 g/dL (ref 12.0–15.0)
O2 Saturation: 98 %
Patient temperature: 97.4
Potassium: 3.6 mmol/L (ref 3.5–5.1)
Sodium: 136 mmol/L (ref 135–145)
TCO2: 27 mmol/L (ref 22–32)
pCO2 arterial: 42.3 mmHg (ref 32–48)
pH, Arterial: 7.395 (ref 7.35–7.45)
pO2, Arterial: 111 mmHg — ABNORMAL HIGH (ref 83–108)

## 2021-11-20 LAB — CBC WITH DIFFERENTIAL/PLATELET
Abs Immature Granulocytes: 0.02 10*3/uL (ref 0.00–0.07)
Basophils Absolute: 0 10*3/uL (ref 0.0–0.1)
Basophils Relative: 1 %
Eosinophils Absolute: 0.5 10*3/uL (ref 0.0–0.5)
Eosinophils Relative: 7 %
HCT: 38.3 % (ref 36.0–46.0)
Hemoglobin: 13.4 g/dL (ref 12.0–15.0)
Immature Granulocytes: 0 %
Lymphocytes Relative: 32 %
Lymphs Abs: 2.2 10*3/uL (ref 0.7–4.0)
MCH: 29.1 pg (ref 26.0–34.0)
MCHC: 35 g/dL (ref 30.0–36.0)
MCV: 83.1 fL (ref 80.0–100.0)
Monocytes Absolute: 0.6 10*3/uL (ref 0.1–1.0)
Monocytes Relative: 9 %
Neutro Abs: 3.4 10*3/uL (ref 1.7–7.7)
Neutrophils Relative %: 51 %
Platelets: 232 10*3/uL (ref 150–400)
RBC: 4.61 MIL/uL (ref 3.87–5.11)
RDW: 14 % (ref 11.5–15.5)
WBC: 6.8 10*3/uL (ref 4.0–10.5)
nRBC: 0 % (ref 0.0–0.2)

## 2021-11-20 LAB — COMPREHENSIVE METABOLIC PANEL
ALT: 11 U/L (ref 0–44)
AST: 23 U/L (ref 15–41)
Albumin: 3.7 g/dL (ref 3.5–5.0)
Alkaline Phosphatase: 60 U/L (ref 38–126)
Anion gap: 9 (ref 5–15)
BUN: 11 mg/dL (ref 8–23)
CO2: 29 mmol/L (ref 22–32)
Calcium: 9.4 mg/dL (ref 8.9–10.3)
Chloride: 100 mmol/L (ref 98–111)
Creatinine, Ser: 0.87 mg/dL (ref 0.44–1.00)
GFR, Estimated: >60 mL/min (ref >60)
Glucose, Bld: 89 mg/dL (ref 70–99)
Potassium: 3.9 mmol/L (ref 3.5–5.1)
Sodium: 138 mmol/L (ref 135–145)
Total Bilirubin: 1.4 mg/dL — ABNORMAL HIGH (ref 0.3–1.2)
Total Protein: 6.5 g/dL (ref 6.5–8.1)

## 2021-11-20 LAB — URINALYSIS, ROUTINE W REFLEX MICROSCOPIC
Bacteria, UA: NONE SEEN
Bilirubin Urine: NEGATIVE
Glucose, UA: NEGATIVE mg/dL
Hgb urine dipstick: NEGATIVE
Ketones, ur: NEGATIVE mg/dL
Nitrite: NEGATIVE
Protein, ur: NEGATIVE mg/dL
Specific Gravity, Urine: 1.005 (ref 1.005–1.030)
pH: 7 (ref 5.0–8.0)

## 2021-11-20 LAB — RESP PANEL BY RT-PCR (FLU A&B, COVID) ARPGX2
Influenza A by PCR: NEGATIVE
Influenza B by PCR: NEGATIVE
SARS Coronavirus 2 by RT PCR: NEGATIVE

## 2021-11-20 LAB — BRAIN NATRIURETIC PEPTIDE: B Natriuretic Peptide: 38.4 pg/mL (ref 0.0–100.0)

## 2021-11-20 MED ORDER — IPRATROPIUM-ALBUTEROL 0.5-2.5 (3) MG/3ML IN SOLN: 3.0000 mL | RESPIRATORY_TRACT | Status: DC | PRN

## 2021-11-20 MED ORDER — MONTELUKAST SODIUM 10 MG PO TABS
10.0000 mg | ORAL_TABLET | Freq: Every day | ORAL | Status: DC
  Administered 2021-11-20 – 2021-11-21 (×2): 10 mg via ORAL
  Filled 2021-11-20 (×3): qty 1

## 2021-11-20 MED ORDER — IPRATROPIUM-ALBUTEROL 0.5-2.5 (3) MG/3ML IN SOLN: 3.0000 mL | RESPIRATORY_TRACT | Status: DC

## 2021-11-20 MED ORDER — METHYLPREDNISOLONE SODIUM SUCC 125 MG IJ SOLR
125.0000 mg | Freq: Once | INTRAMUSCULAR | Status: AC
  Administered 2021-11-20: 125 mg via INTRAVENOUS
  Filled 2021-11-20: qty 2

## 2021-11-20 MED ORDER — ALBUTEROL SULFATE (2.5 MG/3ML) 0.083% IN NEBU: 3.0000 mL | INHALATION_SOLUTION | RESPIRATORY_TRACT | Status: DC | PRN

## 2021-11-20 MED ORDER — LORAZEPAM 2 MG/ML IJ SOLN
0.5000 mg | Freq: Once | INTRAMUSCULAR | Status: AC
  Administered 2021-11-20: 0.5 mg via INTRAVENOUS
  Filled 2021-11-20: qty 1

## 2021-11-20 MED ORDER — FLUTICASONE PROPIONATE 50 MCG/ACT NA SUSP
2.0000 | Freq: Every day | NASAL | Status: DC
  Administered 2021-11-21 – 2021-11-22 (×2): 2 via NASAL
  Filled 2021-11-20: qty 16

## 2021-11-20 MED ORDER — ALBUTEROL SULFATE (2.5 MG/3ML) 0.083% IN NEBU: 3.0000 mL | INHALATION_SOLUTION | Freq: Four times a day (QID) | RESPIRATORY_TRACT | Status: DC | PRN

## 2021-11-20 MED ORDER — MOMETASONE FURO-FORMOTEROL FUM 200-5 MCG/ACT IN AERO
2.0000 | INHALATION_SPRAY | Freq: Two times a day (BID) | RESPIRATORY_TRACT | Status: DC
  Administered 2021-11-21 – 2021-11-22 (×3): 2 via RESPIRATORY_TRACT
  Filled 2021-11-20: qty 8.8

## 2021-11-20 MED ORDER — METHYLPREDNISOLONE SODIUM SUCC 40 MG IJ SOLR
40.0000 mg | Freq: Every day | INTRAMUSCULAR | Status: DC
  Administered 2021-11-20: 40 mg via INTRAVENOUS
  Filled 2021-11-20: qty 1

## 2021-11-20 MED ORDER — ALBUTEROL SULFATE (2.5 MG/3ML) 0.083% IN NEBU
10.0000 mg/h | INHALATION_SOLUTION | RESPIRATORY_TRACT | Status: DC
  Administered 2021-11-20: 10 mg/h via RESPIRATORY_TRACT
  Filled 2021-11-20: qty 12

## 2021-11-20 MED ORDER — MAGNESIUM SULFATE 2 GM/50ML IV SOLN
2.0000 g | Freq: Once | INTRAVENOUS | Status: AC
  Administered 2021-11-20: 2 g via INTRAVENOUS
  Filled 2021-11-20: qty 50

## 2021-11-20 MED ORDER — IPRATROPIUM-ALBUTEROL 0.5-2.5 (3) MG/3ML IN SOLN: 3.0000 mL | Freq: Once | RESPIRATORY_TRACT | Status: DC

## 2021-11-20 MED ORDER — IPRATROPIUM BROMIDE 0.02 % IN SOLN
0.5000 mg | Freq: Four times a day (QID) | RESPIRATORY_TRACT | Status: DC
  Administered 2021-11-20: 0.5 mg via RESPIRATORY_TRACT
  Filled 2021-11-20: qty 2.5

## 2021-11-20 MED ORDER — ALBUTEROL SULFATE (2.5 MG/3ML) 0.083% IN NEBU
3.0000 mL | INHALATION_SOLUTION | RESPIRATORY_TRACT | Status: DC
  Administered 2021-11-20: 3 mL via RESPIRATORY_TRACT
  Filled 2021-11-20: qty 3

## 2021-11-20 MED ORDER — IPRATROPIUM-ALBUTEROL 0.5-2.5 (3) MG/3ML IN SOLN
3.0000 mL | RESPIRATORY_TRACT | Status: DC
  Administered 2021-11-20 – 2021-11-21 (×3): 3 mL via RESPIRATORY_TRACT
  Filled 2021-11-20 (×3): qty 3

## 2021-11-20 MED ORDER — LORATADINE 10 MG PO TABS
10.0000 mg | ORAL_TABLET | Freq: Every day | ORAL | Status: DC
  Administered 2021-11-21 – 2021-11-22 (×2): 10 mg via ORAL
  Filled 2021-11-20 (×2): qty 1

## 2021-11-20 MED ORDER — ENOXAPARIN SODIUM 40 MG/0.4ML IJ SOSY
40.0000 mg | PREFILLED_SYRINGE | INTRAMUSCULAR | Status: DC
  Administered 2021-11-20: 40 mg via SUBCUTANEOUS
  Filled 2021-11-20: qty 0.4

## 2021-11-20 NOTE — Progress Notes (Signed)
Pt taken off BiPAP by RT and placed on 4l [place]. Pt tolerating well at this time, RN aware, MD aware, RT will continue to monitor.  ?

## 2021-11-20 NOTE — Progress Notes (Signed)
Pt placed on BiPAP by RT. Pt tolerating well at this time, RN at bedside, MD aware, RT will continue to monitor.  °

## 2021-11-20 NOTE — ED Notes (Signed)
Pt c/o feeling like she can't relax.  ?

## 2021-11-20 NOTE — ED Provider Notes (Signed)
[facility] EMERGENCY DEPARTMENT Provider Note   CSN: [id number] Arrival date & time: 11/20/21  1258     History  Chief Complaint  Patient presents with   Shortness of Breath    Helen Vasquez is a 66 y.o. female.  Patient with history of COPD, CHF, asthma presents today with worsening shortness of breath. She states that she was recently diagnosed with CHF, has been experiencing some increased fluid retention and weight gain over the past few days. States that her legs are more swollen and her abdomen is more distended than normal. She also states that she has been wheezing more than normal and is having significant difficulty catching her breath over the past few days. She states that she is currently feeling very anxious due to difficulty catching her breath. She has been attempting to manage at home with her home inhalers and nebulizers without improvement. She is not on oxygen at baseline. She states that she feels similarly to when she was admitted back in January due to COPD exacerbation. She denies any chest pain, headache, fever, chills, nausea, vomiting, dysuria.   The history is provided by the patient. No language interpreter was used.  Shortness of Breath Associated symptoms: cough and wheezing   Associated symptoms: no abdominal pain, no chest pain, no fever, no headaches and no vomiting       Home Medications Prior to Admission medications   Medication Sig Start Date End Date Taking? Authorizing Provider  acetaminophen (TYLENOL) 325 MG tablet Take 2 tablets (650 mg total) by mouth every 6 (six) hours as needed for mild pain (or Fever >/= 101). Patient not taking: Reported on 10/23/2021 10/03/21   Rodolph Bong, MD  albuterol (PROVENTIL) (2.5 MG/3ML) 0.083% nebulizer solution Take 3 mLs (2.5 mg total) by nebulization every 4 (four) hours as needed for wheezing or shortness of breath. Use 3 times daily x5 days, then every 4 hours as needed. Patient not  taking: Reported on 10/23/2021 10/03/21   Rodolph Bong, MD  albuterol (VENTOLIN HFA) 108 (90 Base) MCG/ACT inhaler Inhale 2 puffs into the lungs every 6 (six) hours as needed for wheezing or shortness of breath. 10/23/21   Luciano Cutter, MD  aspirin EC 81 MG tablet Take 81 mg by mouth daily. Swallow whole.    [provider]  budesonide-formoterol (SYMBICORT) 160-4.5 MCG/ACT inhaler Inhale 2 puffs into the lungs in the morning and at bedtime. 10/23/21   Luciano Cutter, MD  cholecalciferol (VITAMIN D3) 25 MCG (1000 UNIT) tablet Take 1,000 Units by mouth daily.    [provider]  fluticasone (FLONASE) 50 MCG/ACT nasal spray Place 2 sprays into both nostrils daily. 10/04/21   Rodolph Bong, MD  guaifenesin (HUMIBID E) 400 MG TABS tablet Take 400 mg by mouth every 6 (six) hours as needed (cough).    [provider]  loratadine (CLARITIN) 10 MG tablet Take 1 tablet (10 mg total) by mouth daily. 10/04/21   Rodolph Bong, MD  losartan-hydrochlorothiazide (HYZAAR) 100-25 MG tablet Take 1 tablet by mouth daily. 09/10/21   [provider]  montelukast (SINGULAIR) 10 MG tablet Take 1 tablet (10 mg total) by mouth at bedtime. 10/23/21   Luciano Cutter, MD  Omega-3 1000 MG CAPS Take 1,000 mg by mouth daily.    [provider]  pantoprazole (PROTONIX) 40 MG tablet Take 1 tablet (40 mg total) by mouth daily at 6 (six) AM. 10/04/21   Rodolph Bong,  MD  polyethylene glycol (MIRALAX / GLYCOLAX) 17 g packet Take 17 g by mouth daily. 10/03/21   Rodolph Bonghompson, Daniel V, MD  senna-docusate (SENOKOT-S) 8.6-50 MG tablet Take 1 tablet by mouth 2 (two) times daily. 10/03/21   Rodolph Bonghompson, Daniel V, MD  simethicone (MYLICON) 80 MG chewable tablet Chew 2 tablets (160 mg total) by mouth 4 (four) times daily for 7 days. 10/03/21 10/10/21  Rodolph Bonghompson, Daniel V, MD      Allergies    Bee venom, Ace inhibitors, and Penicillin g    Review of Systems   Review of Systems   Constitutional:  Negative for chills and fever.  Respiratory:  Positive for cough, shortness of breath and wheezing. Negative for choking and stridor.   Cardiovascular:  Positive for leg swelling. Negative for chest pain and palpitations.  Gastrointestinal:  Negative for abdominal pain, diarrhea, nausea and vomiting.  Neurological:  Negative for headaches.  All other systems reviewed and are negative.  Physical Exam Updated Vital Signs BP (!) 154/101    Pulse (!) 102    Temp 98.2 F (36.8 C) (Oral)    Resp (!) 24    Ht 5' 3.5" (1.613 m)    Wt 99.8 kg    SpO2 100%    BMI 38.36 kg/m  Physical Exam Vitals and nursing note reviewed.  Constitutional:      General: She is not in acute distress.    Appearance: Normal appearance. She is normal weight. She is not ill-appearing, toxic-appearing or diaphoretic.     Comments: Patient sitting upright in bed, tachypneic and anxious appearing sitting in bed in some distress  HENT:     Head: Normocephalic and atraumatic.  Cardiovascular:     Rate and Rhythm: Regular rhythm. Tachycardia present.     Pulses: Normal pulses.     Heart sounds: Normal heart sounds.  Pulmonary:     Effort: Pulmonary effort is normal. No respiratory distress.     Breath sounds: Wheezing present.  Chest:     Chest wall: No tenderness.  Abdominal:     General: There is distension.     Palpations: Abdomen is soft.     Tenderness: There is no abdominal tenderness.  Musculoskeletal:        General: Normal range of motion.     Cervical back: Normal range of motion.     Comments: 1+ pitting edema in bilateral lower extremities  Skin:    General: Skin is warm and dry.  Neurological:     General: No focal deficit present.     Mental Status: She is alert.  Psychiatric:        Mood and Affect: Mood normal.        Behavior: Behavior normal.    ED Results / Procedures / Treatments   Labs (all labs ordered are listed, but only abnormal results are displayed) Labs  Reviewed  COMPREHENSIVE METABOLIC PANEL - Abnormal; Notable for the following components:      Result Value   Total Bilirubin 1.4 (*)    All other components within normal limits  URINALYSIS, ROUTINE W REFLEX MICROSCOPIC - Abnormal; Notable for the following components:   Color, Urine STRAW (*)    Leukocytes,Ua TRACE (*)    All other components within normal limits  RESP PANEL BY RT-PCR (FLU A&B, COVID) [id number]  CBC WITH DIFFERENTIAL/PLATELET  BRAIN NATRIURETIC PEPTIDE  I-STAT ARTERIAL BLOOD GAS, ED    EKG None  Radiology DG Chest 2 View  Result Date: 11/20/2021  CLINICAL DATA:  Shortness of breath. EXAM: CHEST - 2 VIEW COMPARISON:  October 29, 2021. FINDINGS: Stable cardiomediastinal silhouette. Both lungs are clear. The visualized skeletal structures are unremarkable. IMPRESSION: No active cardiopulmonary disease. Electronically Signed   By: Lupita Raider M.D.   On: 11/20/2021 13:46    Procedures Procedures    Medications Ordered in ED Medications  albuterol (PROVENTIL) (2.5 MG/3ML) 0.083% nebulizer solution (has no administration in time range)  methylPREDNISolone sodium succinate (SOLU-MEDROL) 125 mg/2 mL injection 125 mg (125 mg Intravenous Given 11/20/21 1502)    ED Course/ Medical Decision Making/ A&P                           Medical Decision Making Amount and/or Complexity of Data Reviewed Labs: ordered.  Risk Prescription drug management. Decision regarding hospitalization.   This patient presents to the ED for concern of shortness of breath, this involves an extensive number of treatment options, and is a complaint that carries with it a high risk of complications and morbidity.  The differential diagnosis includes COPD exacerbation, ACS, CHF exacerbation, pneumonia. This list is not comprehensive   Co morbidities that complicate the patient evaluation  CHF, COPD   Additional history obtained:  Additional history obtained from prior admission  notes   Lab Tests:  I Ordered, and personally interpreted labs.  The pertinent results include:  no leukocytosis, BNP 38.4, no acute laboratory findings   Imaging Studies ordered:  I ordered imaging studies including CXR  I independently visualized and interpreted imaging which showed no acute findings I agree with the radiologist interpretation   Cardiac Monitoring:  The patient was maintained on a cardiac monitor.  I personally viewed and interpreted the cardiac monitored which showed an underlying rhythm of: no STEMI   Medicines ordered and prescription drug management:  I ordered medication including bipap, solumedrol, and continuous nebulizers for tachypnea and shortness of breath. Ativan for anxiety Reevaluation of the patient after these medicines showed that the patient improved   Test Considered:  I considered CTPE, however patient without chest pain with negative CTPE on 2/14. Lung sounds are wheezing throughout much more consistent with COPD exacerbation. Therefore will defer further imaging at this time   Critical Interventions:  Given patients significant tachypnea and wheezing on exam and new oxygen requirement still tachypneic after 5L [place], started patient on bi-pap  Patient presents today with worsening shortness of breath over the past few days with acute worsening today. She is not normally on oxygen and presents on 2L [place] which was increased to 5L [place]. Given increased work of breathing with diffuse wheezing on exam, decided to start on bi-pap.   Patients BNP is negative, no leukocytosis or infiltrate on CXR. Given significant wheezing on exam with history of COPD, will treat for COPD exacerbation. She will need admission for same. Discussed this with patient who is amenable with plan.    Final Clinical Impression(s) / ED Diagnoses Final diagnoses:  COPD exacerbation [facility])    Rx / DC Orders ED Discharge Orders     None         Vear Clock 11/21/21 0750    Milagros Loll, MD 11/23/21 [phone]

## 2021-11-20 NOTE — Progress Notes (Signed)
RT at bedside to obtain ABG. Patient requests to be back on BIPAP due to dyspnea. ?

## 2021-11-20 NOTE — ED Provider Triage Note (Signed)
Emergency Medicine Provider Triage Evaluation Note ? ?Helen Vasquez , a 66 y.o. female  was evaluated in triage.  Pt complains of difficulty breathing for the last couple of days, history of COPD, asthma, recent diagnosis of congestive heart failure.  Patient reports that she feels similar to recent mission to the hospital.  She is brought in on 2 L nasal cannula, she reports that she normally does not use oxygen at home.  She reports that she has been using her inhaler over the last few days with minimal improvement.  She denies any chest pain, headache, fever, chills, nausea, vomiting, dysuria.  She does endorse some swelling on her hands and feet.. ? ?Review of Systems  ?Positive: Edema, shob ?Negative: Chest pain, NVD ? ?Physical Exam  ?BP (!) 142/98 (BP Location: Right Arm)   Pulse 92   Temp 98.2 ?F (36.8 ?C) (Oral)   Resp (!) 24   SpO2 100%  ?Gen:   Awake, no distress   ?Resp:  Slightly decreased effort throughout, some wheezing noted in right lung field ?MSK:   Moves extremities without difficulty  ?Other:   ? ?Medical Decision Making  ?Medically screening exam initiated at 1:14 PM.  Appropriate orders placed.  Helen Vasquez was informed that the remainder of the evaluation will be completed by another provider, this initial triage assessment does not replace that evaluation, and the importance of remaining in the ED until their evaluation is complete. ? ?Workup initiated ?  ?[name], PA-C ?11/20/21 1315 ? ?

## 2021-11-20 NOTE — ED Notes (Signed)
Prior to going on bipap/ativan pt became very agitated and tried to get up. Ensured pt that respiratory had been called to place bipap.  ?

## 2021-11-20 NOTE — H&P (Addendum)
Family Medicine Teaching [facility] Admission History and Physical Service Pager: [phone]  Patient name: Helen Vasquez Medical record number: [id number] Date of birth: [birth year] Age: 66 y.o. Gender: female  Primary Care Provider: [facility] Consultants: None Code Status: Full  Preferred Emergency Contact: Lucio Edward [phone]  Chief Complaint: SOB  Assessment and Plan: Helen Vasquez is a 66 y.o. female presenting with SOB . PMH is significant for COPD, asthma, HTN.   Acute hypoxic respiratory failure in setting of COPD exacerbation    asthma Pt with known hx of COPD and asthma and was last hospitalized from 1/17-1/19/2023 for hypoxemia secondary to COPD exacerbation. Follows with [facility] pulmonology and was last seen on 10/23/21. Home medications for COPD and asthma include albuterol nebulizer q4h prn, albuterol inhaler 2 puffs q6h prn, symbicort 2 puffs BID, Singulair 10 mg daily, Claritin daily, Flonase daily.  Patient seems unclear about the difference between albuterol inhaler versus Symbicort.  Unsure if she is taking her Symbicort daily. Per ER triage nurse, pt presented with SPO2 in the 80's (Of note, this note also states patient had been off of her Lasix for 3 weeks and that she had a history of CHF.  Patient denies any history of CHF or ever taking Lasix.  States she had her heart evaluated recently and everything was fine.  We will look further into this further).  In ED, pt was placed on BiPAP, given solumedrol IV 125 mg, albuterol nebulizer and 0.5 mg ativan for anxiety. CXR showed expanded lungs consistent with COPD and showed cardiomegaly, BP has been elevated 129-194/97-140, labs showed CBC w/ diff WNL, BNP 38.4, CMP unremarkable, UA negative for UTI, and negative for COVID and Flu.  As labs are within normal limits, patient is afebrile and chest x-ray shows no signs of infection, this is less likely to be caused by infectious etiology. Unlikely heart failure  given euvolemic status, normal BNP, no JVD or lower extremity edema appreciated. Considered PE, wells score low risk 1.5 and tachycardia likely due to albuterol that was given but can consider CTA if indicated. This is likely an exacerbation of COPD possibly due to season change and/or medication nonadherence with Symbicort as patient was unsure if she was taking it.  -Admit to MedSurg, attending Dr. Leveda Anna -Continuous albuterol nebulizer and BiPAP per respiratory -Keep oxygen saturation greater 92% with supplemental oxygen prn -Albuterol 2 puffs every 6 hours. -DuoNebs every 4 hours as needed -Will consider need for abx -Solu-Medrol 40 mg tomorrow -Dulera 2 puffs twice daily -Continue home Claritin -Continue home Singulair -Continue home Flonase -PT/OT eval and treat  HTN Pt states she used to take Hyzaar 100-25 mg daily but stopped d/t price.  She states her doctor prescribed her a new blood pressure medication but she does not have it with her and does not remember what it is called.  BP was elevated as high as 194/125 but now down to 130s/80s.  -We will hold blood pressure medications until med rec complete as BP is stable at this time  FEN/GI: Regular diet Prophylaxis: Lovenox  Disposition: MedSurg  History of Present Illness:  Helen Vasquez is a 66 y.o. female presenting with SOB.   Endorses feeling short of breath for the past few days, worsened today. Was advised from the [place] [facility] in [place] yesterday to come to the ED today. Felt light headed and dizzy today. Tried nebulizer treatments at home but didn't work.  Patient went to urgent care earlier today but rather than  having full work-up there she decided to come to the ED.  Does endorse some some sharp pains in her head. Denies chest pain, fevers, chills, stomach pain, does endorse stomach bloated. Denies nausea, vomiting, constipation, diarrhea. Denies issues with urination. Does endorse bad sinus pressure and nasal drainage.  Endorses pain in left arm for about 3 days, things she may have slept on it wrong. Also endorses fingers and toes cramping up which she noticed after she started taking her new blood pressure medication which she cannot remember the name of.   Denies tobacco use but states she used to smoke. Denies alcohol use, recreational drug use  Does endorse taking all of her medications but seems unsure about the difference between albuterol inhaler versus Symbicort. Does state she was switched from her Losartan/HCTZ due to cost and when she started taking new medication (she is not sure of the name) she started having cramps.   She states she doesn't knows anything about lasix. Denies having any history of heart failure.   Review Of Systems: As noted in HPI  Review of Systems   Patient Active Problem List   Diagnosis Date Noted   COPD with asthma (HCC) 10/23/2021   Hypertension 10/01/2021   Abdominal bloating 10/01/2021    Past Medical History: Past Medical History:  Diagnosis Date   Asthma    COPD (chronic obstructive pulmonary disease) (HCC)    High blood pressure     Past Surgical History: History reviewed. No pertinent surgical history.  Social History: Social History   Tobacco Use   Smoking status: Former    Packs/day: 0.50    Years: 43.00    Pack years: 21.50    Types: Cigarettes   Smokeless tobacco: Never   Tobacco comments:    States she smoked off and on from 1979 until 2022  Substance Use Topics   Alcohol use: Never   Drug use: Never     Family History: Family History  Problem Relation Age of Onset   Hypertension Other     Allergies and Medications: Allergies  Allergen Reactions   Bee Venom Anaphylaxis and Hives   Ace Inhibitors Swelling    Reaction(s): Unknown   Penicillin G Hives   No current facility-administered medications on file prior to encounter.   Current Outpatient Medications on File Prior to Encounter  Medication Sig Dispense Refill    acetaminophen (TYLENOL) 325 MG tablet Take 2 tablets (650 mg total) by mouth every 6 (six) hours as needed for mild pain (or Fever >/= 101). (Patient not taking: Reported on 10/23/2021)     albuterol (PROVENTIL) (2.5 MG/3ML) 0.083% nebulizer solution Take 3 mLs (2.5 mg total) by nebulization every 4 (four) hours as needed for wheezing or shortness of breath. Use 3 times daily x5 days, then every 4 hours as needed. (Patient not taking: Reported on 10/23/2021) 125 mL 1   albuterol (VENTOLIN HFA) 108 (90 Base) MCG/ACT inhaler Inhale 2 puffs into the lungs every 6 (six) hours as needed for wheezing or shortness of breath. 8 g 5   aspirin EC 81 MG tablet Take 81 mg by mouth daily. Swallow whole.     budesonide-formoterol (SYMBICORT) 160-4.5 MCG/ACT inhaler Inhale 2 puffs into the lungs in the morning and at bedtime. 10.2 g 5   cholecalciferol (VITAMIN D3) 25 MCG (1000 UNIT) tablet Take 1,000 Units by mouth daily.     fluticasone (FLONASE) 50 MCG/ACT nasal spray Place 2 sprays into both nostrils daily. 11.1 mL 0  guaifenesin (HUMIBID E) 400 MG TABS tablet Take 400 mg by mouth every 6 (six) hours as needed (cough).     loratadine (CLARITIN) 10 MG tablet Take 1 tablet (10 mg total) by mouth daily. 30 tablet 1   losartan-hydrochlorothiazide (HYZAAR) 100-25 MG tablet Take 1 tablet by mouth daily.     montelukast (SINGULAIR) 10 MG tablet Take 1 tablet (10 mg total) by mouth at bedtime. 90 tablet 3   Omega-3 1000 MG CAPS Take 1,000 mg by mouth daily.     pantoprazole (PROTONIX) 40 MG tablet Take 1 tablet (40 mg total) by mouth daily at 6 (six) AM. 30 tablet 1   polyethylene glycol (MIRALAX / GLYCOLAX) 17 g packet Take 17 g by mouth daily. 30 each 0   senna-docusate (SENOKOT-S) 8.6-50 MG tablet Take 1 tablet by mouth 2 (two) times daily.     simethicone (MYLICON) 80 MG chewable tablet Chew 2 tablets (160 mg total) by mouth 4 (four) times daily for 7 days. 30 tablet 0    Objective: BP (!) 174/140    Pulse (!) 104     Temp 98.2 F (36.8 C) (Oral)    Resp (!) 22    Ht 5' 3.5" (1.613 m)    Wt 99.8 kg    SpO2 100%    BMI 38.36 kg/m  Exam: General: Patient sitting up in bed, on BiPAP, NAD Eyes: White sclera, clear conjunctiva Cardiovascular: Regular rhythm, tachycardic, no murmurs Respiratory: Mild inspiratory wheezes and prominent expiratory wheezes throughout Gastrointestinal: Soft, nontender to palpation, nondistended Extremities: No edema BLEs Derm: Warm and dry Neuro: No focal deficit Psych: Mood and affect appropriate for situation  Labs and Imaging: CBC BMET  Recent Labs  Lab 11/20/21 1322  WBC 6.8  HGB 13.4  HCT 38.3  PLT 232   Recent Labs  Lab 11/20/21 1322  NA 138  K 3.9  CL 100  CO2 29  BUN 11  CREATININE 0.87  GLUCOSE 89  CALCIUM 9.4     EKG: sinus rhythm with occasional PVC, age undetermined septal infarct, QTc 457  DG Chest 2 View  Result Date: 11/20/2021 CLINICAL DATA:  Shortness of breath. EXAM: CHEST - 2 VIEW COMPARISON:  October 29, 2021. FINDINGS: Stable cardiomediastinal silhouette. Both lungs are clear. The visualized skeletal structures are unremarkable. IMPRESSION: No active cardiopulmonary disease. Electronically Signed   By: Lupita Raider M.D.   On: 11/20/2021 13:46     Erick Alley, DO 11/20/2021, 3:56 PM PGY-1, [facility] FPTS Intern pager: [phone], text pages welcome   FPTS Upper-Level Resident Addendum   I have independently interviewed and examined the patient. I have discussed the above with the original author and agree with their documentation. My edits for correction/addition/clarification are included. Please see also any attending notes.   Franchot Erichsen, D.O. PGY-2, [facility] 11/20/2021 7:48 PM  FPTS Service pager: [phone] (text pages welcome through [facility])

## 2021-11-20 NOTE — ED Triage Notes (Signed)
Pt [facility]. Reports hx of chf and has been out of her lasix x 3 weeks. Increase in sob with exertion, swelling to legs and abd. At [facility], room air spo2 was in the 80s. Per ems, 100% on 4L [place].  ?

## 2021-11-21 ENCOUNTER — Inpatient Hospital Stay (HOSPITAL_COMMUNITY): Payer: No Typology Code available for payment source

## 2021-11-21 ENCOUNTER — Other Ambulatory Visit (HOSPITAL_COMMUNITY): Payer: Self-pay

## 2021-11-21 DIAGNOSIS — R0609 Other forms of dyspnea: Secondary | ICD-10-CM

## 2021-11-21 LAB — BASIC METABOLIC PANEL
Anion gap: 12 (ref 5–15)
BUN: 19 mg/dL (ref 8–23)
CO2: 27 mmol/L (ref 22–32)
Calcium: 9.9 mg/dL (ref 8.9–10.3)
Chloride: 97 mmol/L — ABNORMAL LOW (ref 98–111)
Creatinine, Ser: 1.28 mg/dL — ABNORMAL HIGH (ref 0.44–1.00)
GFR, Estimated: 46 mL/min — ABNORMAL LOW (ref >60)
Glucose, Bld: 150 mg/dL — ABNORMAL HIGH (ref 70–99)
Potassium: 4.2 mmol/L (ref 3.5–5.1)
Sodium: 136 mmol/L (ref 135–145)

## 2021-11-21 LAB — CBC
HCT: 38.3 % (ref 36.0–46.0)
Hemoglobin: 13.5 g/dL (ref 12.0–15.0)
MCH: 29 pg (ref 26.0–34.0)
MCHC: 35.2 g/dL (ref 30.0–36.0)
MCV: 82.4 fL (ref 80.0–100.0)
Platelets: 239 10*3/uL (ref 150–400)
RBC: 4.65 MIL/uL (ref 3.87–5.11)
RDW: 13.9 % (ref 11.5–15.5)
WBC: 7.1 10*3/uL (ref 4.0–10.5)
nRBC: 0 % (ref 0.0–0.2)

## 2021-11-21 LAB — ECHOCARDIOGRAM COMPLETE
AR max vel: 2.9 cm2
AV Area VTI: 2.19 cm2
AV Area mean vel: 2.57 cm2
AV Mean grad: 7 mmHg
AV Peak grad: 11.6 mmHg
Ao pk vel: 1.7 m/s
Calc EF: 64 %
Height: 63 in
S' Lateral: 1.7 cm
Single Plane A2C EF: 55.3 %
Single Plane A4C EF: 69.2 %
Weight: 3508.8 oz

## 2021-11-21 LAB — D-DIMER, QUANTITATIVE: D-Dimer, Quant: 0.41 ug/mL-FEU (ref 0.00–0.50)

## 2021-11-21 LAB — TROPONIN I (HIGH SENSITIVITY)
Troponin I (High Sensitivity): 8 ng/L (ref <18)
Troponin I (High Sensitivity): 14 ng/L (ref <18)

## 2021-11-21 MED ORDER — UMECLIDINIUM BROMIDE 62.5 MCG/ACT IN AEPB
1.0000 | INHALATION_SPRAY | Freq: Every day | RESPIRATORY_TRACT | Status: DC
  Administered 2021-11-21 – 2021-11-22 (×2): 1 via RESPIRATORY_TRACT
  Filled 2021-11-21: qty 7

## 2021-11-21 MED ORDER — ENOXAPARIN SODIUM 60 MG/0.6ML IJ SOSY
50.0000 mg | PREFILLED_SYRINGE | INTRAMUSCULAR | Status: DC
  Administered 2021-11-21: 18:00:00 50 mg via SUBCUTANEOUS
  Filled 2021-11-21: qty 0.6

## 2021-11-21 MED ORDER — PREDNISONE 20 MG PO TABS
40.0000 mg | ORAL_TABLET | Freq: Every day | ORAL | Status: DC
  Administered 2021-11-21 – 2021-11-22 (×2): 40 mg via ORAL
  Filled 2021-11-21 (×2): qty 2

## 2021-11-21 MED ORDER — IPRATROPIUM-ALBUTEROL 0.5-2.5 (3) MG/3ML IN SOLN
3.0000 mL | Freq: Two times a day (BID) | RESPIRATORY_TRACT | Status: DC
  Administered 2021-11-21 – 2021-11-22 (×2): 3 mL via RESPIRATORY_TRACT
  Filled 2021-11-21 (×2): qty 3

## 2021-11-21 MED ORDER — GUAIFENESIN ER 600 MG PO TB12: 600.0000 mg | ORAL_TABLET | Freq: Two times a day (BID) | ORAL | Status: DC | PRN

## 2021-11-21 NOTE — TOC Benefit Eligibility Note (Signed)
Patient Advocate Encounter ?  ?Insurance verification completed.   ?  ?The patient is currently admitted and upon discharge could be taking TRELEGY ELLIPTA. ?  ?The current 30 day co-pay is, $4.30.  ? ?The patient is currently admitted and upon discharge could be taking BREZTRI AEROSPERE. ?  ?The current 30 day co-pay is, $4.30.  ? ?The patient is insured through [employer]. ? ? ?  ? ?

## 2021-11-21 NOTE — Progress Notes (Signed)
FPTS Interim Night Progress Note ? ?S:Patient sleeping comfortably.  Rounded with primary night RN.  No concerns voiced.  No orders required.   ? ?O: ?Today's Vitals  ? 11/20/21 2210 11/20/21 2319 11/20/21 2331 11/21/21 0000  ?BP: 127/77     ?Pulse: 78  (!) 101   ?Resp: 20  17   ?Temp: 97.8 ?F (36.6 ?C)     ?TempSrc: Oral     ?SpO2: 95% 94% 99%   ?Weight:      ?Height:      ?PainSc:    0-No pain  ? ? ? ? ?A/P: ?Continue current management ? ?Dana Allan MD ?PGY-3, [facility] ?Service pager [phone]   ?

## 2021-11-21 NOTE — TOC Progression Note (Signed)
Transition of Care (TOC) - Progression Note  ? ? ?Patient Details  ?Name: Cally Nygard ?MRN: [id number] ?Date of Birth: November 08, 1955 ? ?Transition of Care (TOC) CM/SW Contact  ?Khyrie Masi Wynn Banker, LCSWA ?Phone Number: ?11/21/2021, 9:16 AM ? ?Clinical Narrative:    ? ?Transition of Care (TOC) Screening Note ? ? ?Patient Details  ?Name: Tikesha Mort ?Date of Birth: 12-31-1955 ? ? ?Transition of Care (TOC) CM/SW Contact:    ?Ivette Loyal, LCSWA ?Phone Number: ?11/21/2021, 9:16 AM ? ? ? ?Transition of Care Department [facility]) has reviewed patient and no TOC needs have been identified at this time. We will continue to monitor patient advancement through interdisciplinary progression rounds. If new patient transition needs arise, please place a TOC consult. ?  ? ? ?  ?  ? ?Expected Discharge Plan and Services ?  ?  ?  ?  ?  ?                ?  ?  ?  ?  ?  ?  ?  ?  ?  ?  ? ? ?Social Determinants of Health (SDOH) Interventions ?  ? ?Readmission Risk Interventions ?No flowsheet data found. ? ?

## 2021-11-21 NOTE — Plan of Care (Signed)
°  Problem: Respiratory: °Goal: Ability to maintain adequate ventilation will improve °Outcome: Progressing °  °

## 2021-11-21 NOTE — Progress Notes (Addendum)
FPTS Interim Progress Note ? ?S: Was messaged by patient's nurse that patient was experiencing chest pressure and lightheadedness.  Went to see patient who stated the lightheadedness occurred first, was very brief, she felt like her vision was closing in, she lied back and it resolved.  After she was feeling better, patient states she then experienced a squeezing pressure-like sensation on the left side of her chest which lasted 2 to 3 minutes and then resolved.  Patient states she has experienced this sensation about 3 times per week lately and it is typically related to movement of her left arm, sometimes has a squeezing sensation in her right chest when she moves her right arm. ? ?O: ?BP 110/62   Pulse 99   Temp (!) 97.4 ?F (36.3 ?C) (Oral)   Resp 20   Ht 5\' 3"  (1.6 m)   Wt 99.5 kg   SpO2 94%   BMI 38.85 kg/m?   ?General:  patient sitting up in bed, NAD ?Cardio: RRR normal S1/S2 ?Respiratory: Patient breathing comfortably on room air ?MSK: No pain with palpation of chest wall, no pain with extension flexion ?Abduction or abduction of bilateral upper extremities against resistance ?Neuro: CN II-XII intact, sensation intact, alternating hand movements normal ? ?A/P: ?Chest pressure  lightheadedness ?As patient states she experiences tightness/pressure sensation in her chest when she moves her arm, I suspect it is most likely musculoskeletal in nature however there is no pain on palpation and I am unable to reproduce the pain when having patient move her arms with resistance.  It was very brief and resolved.  Patient is tachycardic and continues to have PVCs.  Tachycardia likely due to albuterol and her DuoNebs.  Echocardiogram this morning is very reassuring with ejection fraction of 60 to 65%, normal left ventricular function, no regional wall motion abnormalities, there is mild concentric left ventricular hypertrophy and left ventricular diastolic parameters were indeterminate. ?It is possible that PVCs  are causing patient's lightheadedness.  We will continue to monitor for further episodes of lightheadedness. ?-CTM ?-EKG ?-trops ?-CXR ? ? , DO ?11/21/2021, 12:02 PM ?PGY-1, [facility] ?Service pager [phone] ? ?

## 2021-11-21 NOTE — Evaluation (Signed)
Physical Therapy Evaluation ?Patient Details ?Name: Helen Vasquez ?MRN: [id number] ?DOB: 12-24-1955 ?Today's Date: 11/21/2021 ? ?History of Present Illness ? The pt is a 66 yo female presenting 3/8 with SOB and LE edema after not taking lasix x3 weeks. Pt found to be hypoxic upon arrival, with COPD exacerbation. PMH includes: CHF, COPD, and HTN. ?  ?Clinical Impression ? Pt in bed upon arrival of PT, agreeable to evaluation at this time. Prior to admission the pt was mobilizing short distances without need for AD, but reports significant limitation in endurance both with mobility and ADLs. The pt lives alone in apt with elevator entrance. The pt now presents with limitations in functional mobility, power, stability, and endurance due to above dx, and will continue to benefit from skilled PT to address these deficits. She was able to complete bed mobility and short distance mobility in the room without assist, but does benefit from rollator for longer bouts of hallway ambulation at this time. SpO2 remained >94% with RA with all activity, but the pt required multiple standing rest breaks and seated rest after 75 ft to recover. HR to 130s with activity. Will continue to benefit from skilled PT acutely to progress activity and endurance, will be safe to return home with intermittent family support when medically ready.  ? ?Gait Speed: 0.75m/s. (Gait speed < 1.3m/s indicates increased risk of falls) ?   ?   ? ?Recommendations for follow up therapy are one component of a multi-disciplinary discharge planning process, led by the attending physician.  Recommendations may be updated based on patient status, additional functional criteria and insurance authorization. ? ?Follow Up Recommendations Home health PT ? ?  ?Assistance Recommended at Discharge Intermittent Supervision/Assistance  ?Patient can return home with the following ? A little help with walking and/or transfers;Help with stairs or ramp for entrance;Assistance with  cooking/housework ? ?  ?Equipment Recommendations Rollator (4 wheels) (tub bench)  ?Recommendations for Other Services ?    ?  ?Functional Status Assessment Patient has had a recent decline in their functional status and demonstrates the ability to make significant improvements in function in a reasonable and predictable amount of time.  ? ?  ?Precautions / Restrictions Precautions ?Precautions: Fall ?Restrictions ?Weight Bearing Restrictions: No  ? ?  ? ?Mobility ? Bed Mobility ?Overal bed mobility: Modified Independent ?  ?  ?  ?  ?  ?  ?General bed mobility comments: HOB elevated, increased time, no assist ?  ? ?Transfers ?Overall transfer level: Modified independent ?Equipment used: None ?  ?  ?  ?  ?  ?  ?  ?General transfer comment: able to stand without instability or AD ?  ? ?Ambulation/Gait ?Ambulation/Gait assistance: Supervision, Min guard ?Gait Distance (Feet): 75 Feet (+ 75 ft + 50 ft) ?Assistive device: None, Rollator (4 wheels) ?Gait Pattern/deviations: Step-through pattern, Decreased stride length ?Gait velocity: 0.63 m/s ?Gait velocity interpretation: 1.31 - 2.62 ft/sec, indicative of limited community ambulator ?  ?General Gait Details: pt with slow but steady gait, increased lateral movement without UE support. pt ambulating 75 ft then standing rest then returned to room. seated rest for 2 min then 50 ft with rollator and improved endurance. SpO2 >94% with all ambulation on RA ? ?  ? ?Balance Overall balance assessment: Mild deficits observed, not formally tested ?  ?  ?  ?  ?  ?  ?  ?  ?  ?  ?  ?  ?  ?  ?  ?  ?  ?  ?   ? ? ? ?  Pertinent Vitals/Pain Pain Assessment ?Pain Assessment: No/denies pain  ? ? ?Home Living Family/patient expects to be discharged to:: Private residence ?Living Arrangements: Alone ?Available Help at Discharge: Family;Available PRN/intermittently ?Type of Home: Apartment ?Home Access: Elevator ?  ?  ?  ?Home Layout: One level ?Home Equipment: None ?Additional Comments: pt  states she needs shower bars, and seat for shower  ?  ?Prior Function Prior Level of Function : Independent/Modified Independent ?  ?  ?  ?  ?  ?  ?Mobility Comments: pt reports ind with community ambulation, gets SOB but powers through, denies falls ?ADLs Comments: pt reports ind with ADLs/IADLs ?  ? ? ?Hand Dominance  ? Dominant Hand: Left ? ?  ?Extremity/Trunk Assessment  ? Upper Extremity Assessment ?Upper Extremity Assessment: Overall WFL for tasks assessed ?  ? ?Lower Extremity Assessment ?Lower Extremity Assessment: Overall WFL for tasks assessed ?  ? ?Cervical / Trunk Assessment ?Cervical / Trunk Assessment: Other exceptions ?Cervical / Trunk Exceptions: obese  ?Communication  ? Communication: No difficulties  ?Cognition Arousal/Alertness: Awake/alert ?Behavior During Therapy: [facility] for tasks assessed/performed ?Overall Cognitive Status: Within Functional Limits for tasks assessed ?  ?  ?  ?  ?  ?  ?  ?  ?  ?  ?  ?  ?  ?  ?  ?  ?  ?  ?  ? ?  ?General Comments General comments (skin integrity, edema, etc.): VSS on RA with all activity ? ?  ?   ? ?Assessment/Plan  ?  ?PT Assessment Patient needs continued PT services  ?PT Problem List Decreased activity tolerance;Decreased balance;Cardiopulmonary status limiting activity ? ?   ?  ?PT Treatment Interventions Gait training;DME instruction;Stair training;Functional mobility training;Therapeutic activities;Therapeutic exercise;Balance training;Patient/family education   ? ?PT Goals (Current goals can be found in the Care Plan section)  ?Acute Rehab PT Goals ?Patient Stated Goal: return home and remain independent ?PT Goal Formulation: With patient ?Time For Goal Achievement: 12/05/21 ?Potential to Achieve Goals: Good ? ?  ?Frequency Min 3X/week ?  ? ? ?   ?AM-PAC PT "6 Clicks" Mobility  ?Outcome Measure Help needed turning from your back to your side while in a flat bed without using bedrails?: None ?Help needed moving from lying on your back to sitting on the side  of a flat bed without using bedrails?: None ?Help needed moving to and from a bed to a chair (including a wheelchair)?: A Little ?Help needed standing up from a chair using your arms (e.g., wheelchair or bedside chair)?: A Little ?Help needed to walk in hospital room?: A Little ?Help needed climbing 3-5 steps with a railing? : A Little ?6 Click Score: 20 ? ?  ?End of Session Equipment Utilized During Treatment: Gait belt ?Activity Tolerance: Patient tolerated treatment well ?Patient left: in bed;with call bell/phone within reach;with family/visitor present ?Nurse Communication: Mobility status ?PT Visit Diagnosis: Other abnormalities of gait and mobility (R26.89);Muscle weakness (generalized) (M62.81) ?  ? ?Time: 5784-6962 ?PT Time Calculation (min) (ACUTE ONLY): 28 min ? ? ?Charges:   PT Evaluation ?$PT Eval Low Complexity: 1 Low ?PT Treatments ?$Therapeutic Exercise: 8-22 mins ?  ?   ? ? ?Vickki Muff, PT, DPT  ? ?Acute Rehabilitation Department ?Pager #: [phone] ? ?Helen Vasquez ?11/21/2021, 10:45 AM ? ?

## 2021-11-21 NOTE — Progress Notes (Signed)
Patient refused the use of CPAP for the evening.  

## 2021-11-21 NOTE — Plan of Care (Signed)
°  Problem: Education: °Goal: Knowledge of disease or condition will improve °Outcome: Progressing °Goal: Knowledge of the prescribed therapeutic regimen will improve °Outcome: Progressing °Goal: Individualized Educational Video(s) °Outcome: Progressing °  °Problem: Activity: °Goal: Ability to tolerate increased activity will improve °Outcome: Progressing °Goal: Will verbalize the importance of balancing activity with adequate rest periods °Outcome: Progressing °  °Problem: Respiratory: °Goal: Ability to maintain a clear airway will improve °Outcome: Progressing °Goal: Levels of oxygenation will improve °Outcome: Progressing °Goal: Ability to maintain adequate ventilation will improve °Outcome: Progressing °  °Problem: Education: °Goal: Knowledge of General Education information will improve °Description: Including pain rating scale, medication(s)/side effects and non-pharmacologic comfort measures °Outcome: Progressing °  °Problem: Health Behavior/Discharge Planning: °Goal: Ability to manage health-related needs will improve °Outcome: Progressing °  °Problem: Clinical Measurements: °Goal: Ability to maintain clinical measurements within normal limits will improve °Outcome: Progressing °Goal: Will remain free from infection °Outcome: Progressing °Goal: Diagnostic test results will improve °Outcome: Progressing °Goal: Respiratory complications will improve °Outcome: Progressing °Goal: Cardiovascular complication will be avoided °Outcome: Progressing °  °Problem: Activity: °Goal: Risk for activity intolerance will decrease °Outcome: Progressing °  °Problem: Nutrition: °Goal: Adequate nutrition will be maintained °Outcome: Progressing °  °Problem: Coping: °Goal: Level of anxiety will decrease °Outcome: Progressing °  °Problem: Elimination: °Goal: Will not experience complications related to bowel motility °Outcome: Progressing °Goal: Will not experience complications related to urinary retention °Outcome: Progressing °   °Problem: Pain Managment: °Goal: General experience of comfort will improve °Outcome: Progressing °  °Problem: Safety: °Goal: Ability to remain free from injury will improve °Outcome: Progressing °  °Problem: Skin Integrity: °Goal: Risk for impaired skin integrity will decrease °Outcome: Progressing °  °Problem: Education: °Goal: Ability to demonstrate management of disease process will improve °Outcome: Progressing °Goal: Ability to verbalize understanding of medication therapies will improve °Outcome: Progressing °Goal: Individualized Educational Video(s) °Outcome: Progressing °  °Problem: Activity: °Goal: Capacity to carry out activities will improve °Outcome: Progressing °  °Problem: Cardiac: °Goal: Ability to achieve and maintain adequate cardiopulmonary perfusion will improve °Outcome: Progressing °  °

## 2021-11-21 NOTE — Evaluation (Signed)
Occupational Therapy Evaluation ?Patient Details ?Name: Helen Vasquez ?MRN: [id number] ?DOB: 1955-10-23 ?Today's Date: 11/21/2021 ? ? ?History of Present Illness 66 yo female presenting 3/8 with SOB and LE edema after not taking lasix x3 weeks. Pt found to be hypoxic upon arrival, with COPD exacerbation. PMH includes: CHF, COPD, and HTN.  ? ?Clinical Impression ?  ?PTA, pt was living alone and was independent. Currently, pt requires Supervision for ADLs and functional mobility. Provided education and handouts on LB ADLs with AE (sock aide), toileting (with toilet aid), and taking rest breaks for energy conservation; pt demonstrated and verbalized understanding. Pt would benefit from further acute OT to provide education on use of reach for LB dressing and information for EC during ADLs. Recommend dc to home once medically stable per physician.    ? ?Recommendations for follow up therapy are one component of a multi-disciplinary discharge planning process, led by the attending physician.  Recommendations may be updated based on patient status, additional functional criteria and insurance authorization.  ? ?Follow Up Recommendations ? No OT follow up  ?  ?Assistance Recommended at Discharge PRN  ?Patient can return home with the following Assistance with cooking/housework ? ?  ?Functional Status Assessment ? Patient has had a recent decline in their functional status and demonstrates the ability to make significant improvements in function in a reasonable and predictable amount of time.  ?Equipment Recommendations ? Tub/shower bench  ?  ?Recommendations for Other Services   ? ? ?  ?Precautions / Restrictions Precautions ?Precautions: Other (comment) ?Precaution Comments: Decreased activity tolerance  ? ?  ? ?Mobility Bed Mobility ?Overal bed mobility: Modified Independent ?  ?  ?  ?  ?  ?  ?  ?  ? ?Transfers ?Overall transfer level: Modified independent ?  ?  ?  ?  ?  ?  ?  ?  ?  ?  ? ?  ?Balance Overall balance assessment:  No apparent balance deficits (not formally assessed) ?  ?  ?  ?  ?  ?  ?  ?  ?  ?  ?  ?  ?  ?  ?  ?  ?  ?  ?   ? ?ADL either performed or assessed with clinical judgement  ? ?ADL Overall ADL's : Needs assistance/impaired ?Eating/Feeding: Set up;Sitting ?  ?Grooming: Oral care;Standing;Sitting;Supervision/safety;Set up ?  ?Upper Body Bathing: Supervision/ safety;Standing ?  ?Lower Body Bathing: Supervison/ safety;Sit to/from stand ?  ?Upper Body Dressing : Supervision/safety;Sitting ?  ?Lower Body Dressing: Sit to/from stand;Supervision/safety;Cueing for compensatory techniques;With adaptive equipment ?Lower Body Dressing Details (indicate cue type and reason): Provided education on use of sock aide for donning socks. Would benefit from further education for reacher to don underwear and pants ?Toilet Transfer: Supervision/safety ?  ?  ?Toileting - Clothing Manipulation Details (indicate cue type and reason): Pt also reporting difficulty performing peri care. Providing education on use of toielt aide. ?  ?  ?Functional mobility during ADLs: Supervision/safety ?General ADL Comments: Pt performing sponge bath at sink with SUpervision. Providing education on use of sock aid and toilet aid to asssit with limited ROM and decreased actiivyt tolerance.  ? ? ? ?Vision   ?   ?   ?Perception   ?  ?Praxis   ?  ? ?Pertinent Vitals/Pain Pain Assessment ?Pain Assessment: No/denies pain  ? ? ? ?Hand Dominance Left ?  ?Extremity/Trunk Assessment Upper Extremity Assessment ?Upper Extremity Assessment: Overall WFL for tasks assessed ?  ?Lower Extremity Assessment ?  Lower Extremity Assessment: Overall WFL for tasks assessed ?  ?Cervical / Trunk Assessment ?Cervical / Trunk Assessment: Other exceptions ?Cervical / Trunk Exceptions: Increased body habitus ?  ?Communication Communication ?Communication: No difficulties ?  ?Cognition Arousal/Alertness: Awake/alert ?Behavior During Therapy: [facility] for tasks assessed/performed ?Overall Cognitive  Status: Within Functional Limits for tasks assessed ?  ?  ?  ?  ?  ?  ?  ?  ?  ?  ?  ?  ?  ?  ?  ?  ?  ?  ?  ?General Comments  HR 80-110s. SpO2 90s on RA. ? ?  ?Exercises   ?  ?Shoulder Instructions    ? ? ?Home Living Family/patient expects to be discharged to:: Private residence ?Living Arrangements: Alone ?Available Help at Discharge: Family;Available PRN/intermittently ?Type of Home: Apartment ?Home Access: Elevator ?  ?  ?Home Layout: One level ?  ?  ?Bathroom Shower/Tub: Gaffer;Tub/shower unit ?  ?Bathroom Toilet: Standard ?  ?  ?Home Equipment: None ?  ?  ?  ? ?  ?Prior Functioning/Environment Prior Level of Function : Independent/Modified Independent ?  ?  ?  ?  ?  ?  ?Mobility Comments: pt reports ind with community ambulation, gets SOB but powers through, denies falls ?ADLs Comments: pt reports ind with ADLs/IADLs ?  ? ?  ?  ?OT Problem List: Decreased activity tolerance;Decreased knowledge of precautions;Decreased knowledge of use of DME or AE ?  ?   ?OT Treatment/Interventions: Self-care/ADL training;Therapeutic exercise;Energy conservation;DME and/or AE instruction;Patient/family education  ?  ?OT Goals(Current goals can be found in the care plan section) Acute Rehab OT Goals ?Patient Stated Goal: Go home ?OT Goal Formulation: With patient ?Time For Goal Achievement: 12/05/21 ?Potential to Achieve Goals: Good  ?OT Frequency: Min 2X/week ?  ? ?Co-evaluation   ?  ?  ?  ?  ? ?  ?AM-PAC OT "6 Clicks" Daily Activity     ?Outcome Measure Help from another person eating meals?: None ?Help from another person taking care of personal grooming?: None ?Help from another person toileting, which includes using toliet, bedpan, or urinal?: A Little ?Help from another person bathing (including washing, rinsing, drying)?: A Little ?Help from another person to put on and taking off regular upper body clothing?: None ?Help from another person to put on and taking off regular lower body clothing?: A Little ?6  Click Score: 21 ?  ?End of Session Nurse Communication: Mobility status ? ?Activity Tolerance: Patient tolerated treatment well ?Patient left: with call bell/phone within reach ? ?OT Visit Diagnosis: Unsteadiness on feet (R26.81);Other abnormalities of gait and mobility (R26.89);Muscle weakness (generalized) (M62.81)  ?              ?Time: [id number] ?OT Time Calculation (min): 46 min ?Charges:  OT General Charges ?$OT Visit: 1 Visit ?OT Evaluation ?$OT Eval Low Complexity: 1 Low ?OT Treatments ?$Self Care/Home Management : 23-37 mins ? ?Rina Adney MSOT, OTR/L ?Acute Rehab ?Pager: [phone] ?Office: [phone] ? ?Heywood Footman Hoyt Leanos ?11/21/2021, 2:12 PM ?

## 2021-11-21 NOTE — Progress Notes (Addendum)
Patient did not have her blood pressure medication from [facility] because the [place] did not call the medication to [facility]  here in [place].  ? ?The patient pays for this medication out of pocket because the [place] here in [place] does not have a supply so that is why she states fluid increased in her feet, etc.  ? ?The patient has the new bottle here with her in the hospital now that she picked up yesterday. The medication is Telmisartan/HCTZ 80-25 mg tablets NDC [id number]. ? ?She wants the doctor to review her medication tomorrow. ? ?11/22/21 ?Patient is ready to discharge home. Patient has received her medications from the pharmacy and is going home with all of her belongings. ?1705 ?

## 2021-11-21 NOTE — Hospital Course (Addendum)
Helen Vasquez is a 66 y.o.female with a history of COPD, asthma, HTN who was admitted to the [facility] Teaching Service at [facility] for acute hypoxic respiratory failure 2/2 to COPD exacerbation. Her hospital course is detailed below: ? ?Acute hypoxic respiratory failure 2/2 to CPOD exacerbation ?Patient presented tachycardic and tachypneic without improvement via home asthma COPD medications with oxygen requirement up to 6L [place] and eventually requiring BiPAP.  CBC, UA, BMP unremarkable.  CXR did not show any active cardiopulmonary disease.  Patient received IV Solu-Medrol and albuterol nebulizer in accordance with intermittent BiPAP.  ABG was reassuring did not show signs of CO2 retention.  D-dimer was WNL.  Echocardiogram showed ejection fraction of 60 to 65%, normal left ventricular function, no regional wall abnormalities, mild concentric left ventricular hypertrophy and left ventricular diastolic parameters were indeterminate.  Patient was further treated with DuoNebs, prednisone 40 mg daily, Dulera, and Incruse Ellipta.  At discharge, patient was breathing comfortably on room air and not requiring any as needed albuterol.  Patient was discharged with instructions to discontinue Symbicort and start using Trelegy Ellipta. ? ?Hypertension ?Patient's home medication of telmisartan-hydrochlorothiazide 80-20 mg daily was held due to normotensive blood pressures during her hospitalization.  Patient was discharged with instructions to continue to hold this medication and follow-up with her primary care provider.  Consider starting patient on hydrochlorothiazide 25 mg daily only due to normotensive blood pressures. ? ?Lightheadedness  chest pain ?During hospitalization patient complained of 1 episode of brief lightheadedness while sitting which quickly resolved on its own.  She also complained of 1 episode of brief squeezing chest pain which she stated had occurred about 3 times per week previously and was usually  associated with arm movement.  EKG and troponin ruled out MI, and echocardiogram was reassuring.  Patient does have history of having PACs and PVCs which were noted on monitors during hospitalization.  It is possible this could contribute to her lightheadedness. ? ?Other chronic conditions were medically managed with home medications and formulary alternatives as necessary (HTN) ? ?PCP Follow-up Recommendations: ?Hold BP meds until f/u with PCP then maybe consider thiazide only ?Follow-up on lightheadedness and PACs/PVCs being cause of symptoms ?Ensure patient is taking inhalers and medications appropriately ?Ensure follow-up with pulmonologist ?

## 2021-11-21 NOTE — Progress Notes (Signed)
RT went to give patient 0400 breathing tx. Upon arrival patient noted to be off of her CPAP (previously placed on by RT). Patient also noted to be on RA.  RT checked patients oxygen level which was 85%.  RT placed patient on 5L [place] sats went up to 96%.  Patient expressed she had taken off her CPAP about 30 mins prior because it was uncomfortable.  RT expressed the importance of notifying staff to replace her [place] if she is not wearing the CPAP.  Breathing tx given.  Patient VSS. ?

## 2021-11-21 NOTE — Progress Notes (Signed)
Mobility Specialist Progress Note: ? ? 11/21/21 1415  ?Mobility  ?Activity Ambulated with assistance in hallway  ?Level of Assistance Standby assist, set-up cues, supervision of patient - no hands on  ?Assistive Device None  ?Distance Ambulated (ft) 470 ft  ?Activity Response Tolerated well  ?$Mobility charge 1 Mobility  ? ?Pt with increased SOB upon exertion, SpO2 >93% throughout. Required multiple short standing rest breaks, declined use of rollator for energy conservation. Left pt sitting EOB with all needs met.  ? ?Nelta Numbers ?Acute Rehab ?Phone: [phone] ?Office Phone: [phone] ? ?

## 2021-11-21 NOTE — Progress Notes (Addendum)
Family Medicine Teaching Service ?Daily Progress Note ?Intern Pager: [phone] ? ?Patient name: Helen Vasquez Medical record number: [id number] ?Date of birth: 1956/02/19 Age: 66 y.o. Gender: female ? ?Primary Care Provider: [facility] ?Consultants: none ?Code Status: full ? ?Pt Overview and Major Events to Date:  ?11/20/2021-admitted ? ?Assessment and Plan: ?Patient is a 66 year old female presenting with SOB.  PMH significant for COPD, asthma, HTN. ? ?Acute hypoxic respiratory failure in setting of likely COPD exacerbation  asthma ?Patient with known history of COPD and asthma was last hospitalized from 1/17-1/19/2023 for COPD exacerbation.  Overnight patient was placed on BiPAP twice, and then transition to 5 L nasal cannula.  Patient wore her CPAP on and off last night due to being uncomfortable.  Yesterday she received received Solu-Medrol IV 125 mg, continuous albuterol nebulizer followed by duo nebs every 4 hours, albuterol every 2 hours as needed which she has not used, and 2 g of magnesium sulfate IV.  Patient was not started on antibiotics as she initially denied increased cough or sputum production over the last couple days.  However, today patient states she has had some increased mucus in her throat, only coughs in the morning and sometimes coughs up green mucus.  This has been going on for the last 3 to 4 weeks, will order Mucinex.  Overnight ABG was nonconcerning, and D-dimer this morning was WNL.  patient was on 5 L oxygen via nasal cannula with oxygen saturations in high 90's.  While in the room, I was able to take the patient to room air with oxygen saturations in the mid 90s at which point respiratory therapy came in to assess patient. Plan to dischare pt on trelegy ellipta vs symbicort. ?-Solu-Medrol 40 mg PO daily (day 2 of 5) ?-DuoNebs every 12 hours ?-Albuterol every 2 hours as needed ?-Dulera 2 puffs twice daily ?-Add incruse ellipta ?-Singulair 10 mg at bedtime ?-Incentive spirometry  and BiPAP per RT ?-Follow-up echocardiogram ?-PT/OT eval and treat ?-Mucinex 600 mg twice daily as needed ?-goal of >88% ? ?AKI ?Creatinine of 1.28 this morning.  Baseline appears to be 0.7-0.9. ?-AM BMP ? ?HTN ?Home medications include telmisartan-hydrochlorothiazide 80-25 mg daily.  BPs have been normotensive since yesterday afternoon ranging from 103-130/66-87.  ?-Continue to hold home blood pressure medication in the setting of normotensive blood pressures ? ? ?FEN/GI: Regular diet ?PPx: Lovenox ?Dispo: Home possibly tomorrow pending continued medical management ? ?Subjective:  ?Patient states she is doing much better today, denies any shortness of breath.  Only complaint is some sinus pressure. ? ?Objective: ?Temp:  [97.7 ?F (36.5 ?C)-98.2 ?F (36.8 ?C)] 97.7 ?F (36.5 ?C) (03/09 0349) ?Pulse Rate:  [78-116] 92 (03/09 0349) ?Resp:  [17-26] 18 (03/09 0349) ?BP: (103-194)/(66-140) 124/76 (03/09 0349) ?SpO2:  [94 %-100 %] 99 % (03/09 0355) ?FiO2 (%):  [30 %-40 %] 30 % (03/08 2133) ?Weight:  [99.5 kg-99.8 kg] 99.5 kg (03/09 0000) ?Physical Exam: ?General: Patient sitting up in bed, daughter at bedside, NAD ?HEENT: Mild tenderness to palpation of maxillary sinuses ?Cardiovascular: RRR, normal S1/S2 ?Respiratory: Very mild expiratory wheezes in upper lung fields, good air movement throughout ?Extremities: Pitting edema BLEs ? ?Laboratory: ?Recent Labs  ?Lab 11/20/21 ?1322 11/20/21 ?2113 11/21/21 ?0423  ?WBC 6.8  --  7.1  ?HGB 13.4 13.3 13.5  ?HCT 38.3 39.0 38.3  ?PLT 232  --  239  ? ?Recent Labs  ?Lab 11/20/21 ?1322 11/20/21 ?2113 11/21/21 ?0423  ?NA 138 136 136  ?K 3.9 3.6 4.2  ?  CL 100  --  97*  ?CO2 29  --  27  ?BUN 11  --  19  ?CREATININE 0.87  --  1.28*  ?CALCIUM 9.4  --  9.9  ?PROT 6.5  --   --   ?BILITOT 1.4*  --   --   ?ALKPHOS 60  --   --   ?ALT 11  --   --   ?AST 23  --   --   ?GLUCOSE 89  --  150*  ? ? ? ?Erick Alley, DO ?11/21/2021, 7:28 AM ?PGY-1, [facility] ?FPTS Intern pager: [phone],  text pages welcome ? ?

## 2021-11-21 NOTE — Progress Notes (Addendum)
FPTS Brief Progress Note ? ?S: Pt notes that her breathing has improved but feels that it is better on the Bipap rather than [place] that she was recently switched to. Denies feeling wheezy. She recalls that she is fine at home but whenever she has family visit, she gets sick like this.  She endorses slight sputum production today in her throat but otherwise no sputum production for that. ? ?O: ?BP 127/77   Pulse (!) 101   Temp 97.8 ?F (36.6 ?C) (Oral)   Resp 17   Ht 5' 3.5" (1.613 m)   Wt 99.8 kg   SpO2 99%   BMI 38.36 kg/m?   ?General: Awake, alert, able to converse with difficulty ?CV: RRR, no murmurs auscultated ?Pulm: Slight expiratory wheeze anteriorly, diminished breath sounds in bilateral lower lobes posteriorly ? ?A/P: ?Acute hypoxic respiratory failure ?Given patient's notable distress and potentially worsened breathing upon physical exam with diminished airway and lack of wheezing, opted to check ABG which was reassuring and have RT reassess.  Patient was placed back on BiPAP with DuoNebs every 4 hours and IV magnesium for smooth muscle relaxation and appeared more comfortable.  It is questionable still if this is strictly COPD related or if there is a component of CHF given the frequency of her hospitalizations recently.  Therefore echo may be beneficial in further characterizing disease process. ?-Solu-Medrol 40 mg IV daily, DuoNeb every 4 hours, albuterol every 2 hours as needed ?-Continue Dulera 2 puffs twice daily and singular 10 mg nightly ?-Incentive spirometry and BiPAP per RT ?-Echocardiogram ?- Orders reviewed. Labs for AM ordered, which was adjusted as needed.  ? ?Helen Guiles, DO ?11/21/2021, 12:38 AM ?PGY-1, [facility] Night Resident  ?Please page [phone] with questions.  ?  ?

## 2021-11-21 NOTE — Progress Notes (Signed)
Notified on call provider of the spasms and weakness in the right index finger, middle finger and ring finger.  Heating pack provided.  ?

## 2021-11-22 ENCOUNTER — Other Ambulatory Visit (HOSPITAL_COMMUNITY): Payer: Self-pay

## 2021-11-22 ENCOUNTER — Ambulatory Visit: Payer: No Typology Code available for payment source | Admitting: Pulmonary Disease

## 2021-11-22 LAB — BASIC METABOLIC PANEL
Anion gap: 10 (ref 5–15)
BUN: 21 mg/dL (ref 8–23)
CO2: 26 mmol/L (ref 22–32)
Calcium: 9.3 mg/dL (ref 8.9–10.3)
Chloride: 96 mmol/L — ABNORMAL LOW (ref 98–111)
Creatinine, Ser: 0.92 mg/dL (ref 0.44–1.00)
GFR, Estimated: >60 mL/min (ref >60)
Glucose, Bld: 114 mg/dL — ABNORMAL HIGH (ref 70–99)
Potassium: 4.3 mmol/L (ref 3.5–5.1)
Sodium: 132 mmol/L — ABNORMAL LOW (ref 135–145)

## 2021-11-22 MED ORDER — PREDNISONE 20 MG PO TABS
40.0000 mg | ORAL_TABLET | Freq: Every day | ORAL | 0 refills | Status: DC
  Filled 2021-11-22: qty 4, 2d supply, fill #0

## 2021-11-22 MED ORDER — TRELEGY ELLIPTA 100-62.5-25 MCG/ACT IN AEPB
INHALATION_SPRAY | RESPIRATORY_TRACT | 0 refills | Status: DC
  Filled 2021-11-22: qty 60, 30d supply, fill #0

## 2021-11-22 NOTE — Progress Notes (Signed)
Patient ate dinner before leaving. NT Shanda Bumps is transporting patient to front of the hospital to meet her family.  ?Pt is alert and oriented x 4 on room air in wheelchair leaving the unit. Denies pain. ?

## 2021-11-22 NOTE — Plan of Care (Signed)
?Problem: Education: ?Goal: Knowledge of disease or condition will improve ?11/22/2021 1732 by Serafina Mitchell, RN ?Outcome: Completed/Met ?11/22/2021 1732 by Serafina Mitchell, RN ?Outcome: Adequate for Discharge ?11/22/2021 1639 by Serafina Mitchell, RN ?Outcome: Progressing ?Goal: Knowledge of the prescribed therapeutic regimen will improve ?11/22/2021 1732 by Serafina Mitchell, RN ?Outcome: Completed/Met ?11/22/2021 1732 by Serafina Mitchell, RN ?Outcome: Adequate for Discharge ?11/22/2021 1639 by Serafina Mitchell, RN ?Outcome: Progressing ?Goal: Individualized Educational Video(s) ?11/22/2021 1732 by Serafina Mitchell, RN ?Outcome: Completed/Met ?11/22/2021 1732 by Serafina Mitchell, RN ?Outcome: Adequate for Discharge ?11/22/2021 1639 by Serafina Mitchell, RN ?Outcome: Progressing ?  ?Problem: Activity: ?Goal: Ability to tolerate increased activity will improve ?11/22/2021 1732 by Serafina Mitchell, RN ?Outcome: Completed/Met ?11/22/2021 1732 by Serafina Mitchell, RN ?Outcome: Adequate for Discharge ?11/22/2021 1639 by Serafina Mitchell, RN ?Outcome: Progressing ?Goal: Will verbalize the importance of balancing activity with adequate rest periods ?11/22/2021 1732 by Serafina Mitchell, RN ?Outcome: Completed/Met ?11/22/2021 1732 by Serafina Mitchell, RN ?Outcome: Adequate for Discharge ?11/22/2021 1639 by Serafina Mitchell, RN ?Outcome: Progressing ?  ?Problem: Respiratory: ?Goal: Ability to maintain a clear airway will improve ?11/22/2021 1732 by Serafina Mitchell, RN ?Outcome: Completed/Met ?11/22/2021 1732 by Serafina Mitchell, RN ?Outcome: Adequate for Discharge ?11/22/2021 1639 by Serafina Mitchell, RN ?Outcome: Progressing ?Goal: Levels of oxygenation will improve ?11/22/2021 1732 by Serafina Mitchell, RN ?Outcome: Completed/Met ?11/22/2021 1732 by Serafina Mitchell, RN ?Outcome: Adequate for Discharge ?11/22/2021 1639 by Serafina Mitchell, RN ?Outcome: Progressing ?Goal: Ability  to maintain adequate ventilation will improve ?11/22/2021 1732 by Serafina Mitchell, RN ?Outcome: Completed/Met ?11/22/2021 1732 by Serafina Mitchell, RN ?Outcome: Adequate for Discharge ?11/22/2021 1639 by Serafina Mitchell, RN ?Outcome: Progressing ?  ?Problem: Education: ?Goal: Knowledge of General Education information will improve ?Description: Including pain rating scale, medication(s)/side effects and non-pharmacologic comfort measures ?11/22/2021 1732 by Serafina Mitchell, RN ?Outcome: Completed/Met ?11/22/2021 1732 by Serafina Mitchell, RN ?Outcome: Adequate for Discharge ?11/22/2021 1639 by Serafina Mitchell, RN ?Outcome: Progressing ?  ?Problem: Health Behavior/Discharge Planning: ?Goal: Ability to manage health-related needs will improve ?11/22/2021 1732 by Serafina Mitchell, RN ?Outcome: Completed/Met ?11/22/2021 1732 by Serafina Mitchell, RN ?Outcome: Adequate for Discharge ?11/22/2021 1639 by Serafina Mitchell, RN ?Outcome: Progressing ?  ?Problem: Clinical Measurements: ?Goal: Ability to maintain clinical measurements within normal limits will improve ?11/22/2021 1732 by Serafina Mitchell, RN ?Outcome: Completed/Met ?11/22/2021 1732 by Serafina Mitchell, RN ?Outcome: Adequate for Discharge ?11/22/2021 1639 by Serafina Mitchell, RN ?Outcome: Progressing ?Goal: Will remain free from infection ?11/22/2021 1732 by Serafina Mitchell, RN ?Outcome: Completed/Met ?11/22/2021 1732 by Serafina Mitchell, RN ?Outcome: Adequate for Discharge ?11/22/2021 1639 by Serafina Mitchell, RN ?Outcome: Progressing ?Goal: Diagnostic test results will improve ?11/22/2021 1732 by Serafina Mitchell, RN ?Outcome: Completed/Met ?11/22/2021 1732 by Serafina Mitchell, RN ?Outcome: Adequate for Discharge ?11/22/2021 1639 by Serafina Mitchell, RN ?Outcome: Progressing ?Goal: Respiratory complications will improve ?11/22/2021 1732 by Serafina Mitchell, RN ?Outcome: Completed/Met ?11/22/2021 1732 by Serafina Mitchell, RN ?Outcome: Adequate for Discharge ?11/22/2021 1639 by Serafina Mitchell, RN ?Outcome: Progressing ?Goal: Cardiovascular complication will be avoided ?11/22/2021 1732 by Serafina Mitchell, RN ?Outcome: Completed/Met ?11/22/2021 1732 by Serafina Mitchell, RN ?Outcome: Adequate for Discharge ?11/22/2021 1639 by Serafina Mitchell, RN ?Outcome: Progressing ?  ?Problem: Activity: ?Goal: Risk for activity intolerance will decrease ?11/22/2021 1732  by Serafina Mitchell, RN ?Outcome: Completed/Met ?11/22/2021 1732 by Serafina Mitchell, RN ?Outcome: Adequate for Discharge ?11/22/2021 1639 by Serafina Mitchell, RN ?Outcome: Progressing ?  ?Problem: Nutrition: ?Goal: Adequate nutrition will be maintained ?11/22/2021 1732 by Serafina Mitchell, RN ?Outcome: Completed/Met ?11/22/2021 1732 by Serafina Mitchell, RN ?Outcome: Adequate for Discharge ?11/22/2021 1639 by Serafina Mitchell, RN ?Outcome: Progressing ?  ?Problem: Coping: ?Goal: Level of anxiety will decrease ?11/22/2021 1732 by Serafina Mitchell, RN ?Outcome: Completed/Met ?11/22/2021 1732 by Serafina Mitchell, RN ?Outcome: Adequate for Discharge ?11/22/2021 1639 by Serafina Mitchell, RN ?Outcome: Progressing ?  ?Problem: Elimination: ?Goal: Will not experience complications related to bowel motility ?11/22/2021 1732 by Serafina Mitchell, RN ?Outcome: Completed/Met ?11/22/2021 1732 by Serafina Mitchell, RN ?Outcome: Adequate for Discharge ?11/22/2021 1639 by Serafina Mitchell, RN ?Outcome: Progressing ?Goal: Will not experience complications related to urinary retention ?11/22/2021 1732 by Serafina Mitchell, RN ?Outcome: Completed/Met ?11/22/2021 1732 by Serafina Mitchell, RN ?Outcome: Adequate for Discharge ?11/22/2021 1639 by Serafina Mitchell, RN ?Outcome: Progressing ?  ?Problem: Pain Managment: ?Goal: General experience of comfort will improve ?11/22/2021 1732 by Serafina Mitchell, RN ?Outcome: Completed/Met ?11/22/2021 1732 by Serafina Mitchell, RN ?Outcome: Adequate for Discharge ?11/22/2021 1639 by Serafina Mitchell, RN ?Outcome: Progressing ?  ?Problem: Safety: ?Goal: Ability to remain free from injury will improve ?11/22/2021 1732 by Serafina Mitchell, RN ?Outcome: Completed/Met ?11/22/2021 1732 by Serafina Mitchell, RN ?Outcome: Adequate for Discharge ?11/22/2021 1639 by Serafina Mitchell, RN ?Outcome: Progressing ?  ?Problem: Skin Integrity: ?Goal: Risk for impaired skin integrity will decrease ?11/22/2021 1732 by Serafina Mitchell, RN ?Outcome: Completed/Met ?11/22/2021 1732 by Serafina Mitchell, RN ?Outcome: Adequate for Discharge ?11/22/2021 1639 by Serafina Mitchell, RN ?Outcome: Progressing ?  ?Problem: Education: ?Goal: Ability to demonstrate management of disease process will improve ?11/22/2021 1732 by Serafina Mitchell, RN ?Outcome: Completed/Met ?11/22/2021 1732 by Serafina Mitchell, RN ?Outcome: Adequate for Discharge ?11/22/2021 1639 by Serafina Mitchell, RN ?Outcome: Progressing ?Goal: Ability to verbalize understanding of medication therapies will improve ?11/22/2021 1732 by Serafina Mitchell, RN ?Outcome: Completed/Met ?11/22/2021 1732 by Serafina Mitchell, RN ?Outcome: Adequate for Discharge ?11/22/2021 1639 by Serafina Mitchell, RN ?Outcome: Progressing ?Goal: Individualized Educational Video(s) ?11/22/2021 1732 by Serafina Mitchell, RN ?Outcome: Completed/Met ?11/22/2021 1732 by Serafina Mitchell, RN ?Outcome: Adequate for Discharge ?11/22/2021 1639 by Serafina Mitchell, RN ?Outcome: Progressing ?  ?Problem: Activity: ?Goal: Capacity to carry out activities will improve ?11/22/2021 1732 by Serafina Mitchell, RN ?Outcome: Completed/Met ?11/22/2021 1732 by Serafina Mitchell, RN ?Outcome: Adequate for Discharge ?11/22/2021 1639 by Serafina Mitchell, RN ?Outcome: Progressing ?  ?Problem: Cardiac: ?Goal: Ability to achieve and maintain adequate cardiopulmonary perfusion will improve ?11/22/2021 1732 by  Serafina Mitchell, RN ?Outcome: Completed/Met ?11/22/2021 1732 by Serafina Mitchell, RN ?Outcome: Adequate for Discharge ?11/22/2021 1639 by Serafina Mitchell, RN ?Outcome: Progressing ?  ?

## 2021-11-22 NOTE — Discharge Summary (Signed)
Family Medicine Teaching [facility] Discharge Summary  Patient name: Helen Vasquez Medical record number: [id number] Date of birth: 1956-06-28 Age: 66 y.o. Gender: female Date of Admission: 11/20/2021  Date of Discharge: 11/22/2021 Admitting Physician: Moses Manners, MD  Primary Care Provider: [facility] Consultants: none  Indication for Hospitalization: Acute hypoxic respiratory failure d/t COPD exacerbation  Discharge Diagnoses/Problem List:  Principal Problem:   COPD exacerbation (HCC) Active Problems:   Asthma    Disposition: home  Discharge Condition: stable  Discharge Exam:  General: Patient sitting up in bed, just finishing breakfast, NAD Cardiovascular: RRR, tachycardic into the low 100s Respiratory: CTAB, normal effort Abdomen: Normal bowel sounds, soft, nontender to palpation, nondistended Extremities: No edema of BLEs  Brief Hospital Course:  Helen Vasquez is a 66 y.o.female with a history of COPD, asthma, HTN who was admitted to the [facility] at [facility] for acute hypoxic respiratory failure 2/2 to COPD exacerbation. Her hospital course is detailed below:  Acute hypoxic respiratory failure 2/2 to CPOD exacerbation Patient presented tachycardic and tachypneic without improvement via home asthma COPD medications with oxygen requirement up to 6L [place] and eventually requiring BiPAP.  CBC, UA, BMP unremarkable.  CXR did not show any active cardiopulmonary disease.  Patient received IV Solu-Medrol and albuterol nebulizer in accordance with intermittent BiPAP.  ABG was reassuring did not show signs of CO2 retention.  D-dimer was WNL.  Echocardiogram showed ejection fraction of 60 to 65%, normal left ventricular function, no regional wall abnormalities, mild concentric left ventricular hypertrophy and left ventricular diastolic parameters were indeterminate.  Patient was further treated with DuoNebs, prednisone 40 mg daily, Dulera, and Incruse  Ellipta.  At discharge, patient was breathing comfortably on room air and not requiring any as needed albuterol.  Patient was discharged with instructions to discontinue Symbicort and start using Trelegy Ellipta.  Hypertension Patient's home medication of telmisartan-hydrochlorothiazide 80-20 mg daily was held due to normotensive blood pressures during her hospitalization.  Patient was discharged with instructions to continue to hold this medication and follow-up with her primary care provider.  Consider starting patient on hydrochlorothiazide 25 mg daily only due to normotensive blood pressures.  Lightheadedness   chest pain During hospitalization patient complained of 1 episode of brief lightheadedness while sitting which quickly resolved on its own.  She also complained of 1 episode of brief squeezing chest pain which she stated had occurred about 3 times per week previously and was usually associated with arm movement.  EKG and troponin ruled out MI, and echocardiogram was reassuring.  Patient does have history of having PACs and PVCs which were noted on monitors during hospitalization.  It is possible this could contribute to her lightheadedness.  Other chronic conditions were medically managed with home medications and formulary alternatives as necessary (HTN)  PCP Follow-up Recommendations: Hold BP meds until f/u with PCP then maybe consider thiazide only Follow-up on lightheadedness and PACs/PVCs being cause of symptoms Ensure patient is taking inhalers and medications appropriately Ensure follow-up with pulmonologist     Significant Procedures: none  Significant Labs and Imaging:  Recent Labs  Lab 11/20/21 1322 11/20/21 2113 11/21/21 0423  WBC 6.8  --  7.1  HGB 13.4 13.3 13.5  HCT 38.3 39.0 38.3  PLT 232  --  239   Recent Labs  Lab 11/20/21 1322 11/20/21 2113 11/21/21 0423 11/22/21 0238  NA 138 136 136 132*  K 3.9 3.6 4.2 4.3  CL 100  --  97* 96*  CO2 29  --  27 26   GLUCOSE 89  --  150* 114*  BUN 11  --  19 21  CREATININE 0.87  --  1.28* 0.92  CALCIUM 9.4  --  9.9 9.3  ALKPHOS 60  --   --   --   AST 23  --   --   --   ALT 11  --   --   --   ALBUMIN 3.7  --   --   --       Results/Tests Pending at Time of Discharge: none  Discharge Medications:  Allergies as of 11/22/2021       Reactions   Bee Venom Anaphylaxis, Hives   Ace Inhibitors Swelling   Reaction(s): Unknown   Penicillin G Hives        Medication List     STOP taking these medications    acetaminophen 325 MG tablet Commonly known as: TYLENOL   aspirin EC 81 MG tablet   budesonide-formoterol 160-4.5 MCG/ACT inhaler Commonly known as: Symbicort   losartan-hydrochlorothiazide 100-25 MG tablet Commonly known as: HYZAAR   OVER THE COUNTER MEDICATION   potassium chloride 10 MEQ CR capsule Commonly known as: MICRO-K   telmisartan-hydrochlorothiazide 80-25 MG tablet Commonly known as: MICARDIS HCT   VITAMIN K2-VITAMIN D3 PO       TAKE these medications    albuterol (2.5 MG/3ML) 0.083% nebulizer solution Commonly known as: PROVENTIL Take 3 mLs (2.5 mg total) by nebulization every 4 (four) hours as needed for wheezing or shortness of breath. Use 3 times daily x5 days, then every 4 hours as needed. What changed: additional instructions   albuterol 108 (90 Base) MCG/ACT inhaler Commonly known as: VENTOLIN HFA Inhale 2 puffs into the lungs every 6 (six) hours as needed for wheezing or shortness of breath. What changed: Another medication with the same name was changed. Make sure you understand how and when to take each.   cholecalciferol 25 MCG (1000 UNIT) tablet Commonly known as: VITAMIN D3 Take 1,000 Units by mouth daily.   Elderberry Immune Complex Chew Chew 1 capsule by mouth daily.   fluticasone 50 MCG/ACT nasal spray Commonly known as: FLONASE Place 2 sprays into both nostrils daily.   guaifenesin 400 MG Tabs tablet Commonly known as: HUMIBID  E Take 400 mg by mouth every 6 (six) hours as needed (cough).   loratadine 10 MG tablet Commonly known as: CLARITIN Take 1 tablet (10 mg total) by mouth daily.   montelukast 10 MG tablet Commonly known as: SINGULAIR Take 1 tablet (10 mg total) by mouth at bedtime.   Omega-3 1000 MG Caps Take 1,000 mg by mouth daily.   pantoprazole 40 MG tablet Commonly known as: PROTONIX Take 1 tablet (40 mg total) by mouth daily at 6 (six) AM.   polyethylene glycol 17 g packet Commonly known as: MIRALAX / GLYCOLAX Take 17 g by mouth daily.   predniSONE 20 MG tablet Commonly known as: DELTASONE Take 2 tablets (40 mg total) by mouth daily with breakfast. Start taking on: November 23, 2021   senna-docusate 8.6-50 MG tablet Commonly known as: Senokot-S Take 1 tablet by mouth 2 (two) times daily. What changed: when to take this   simethicone 80 MG chewable tablet Commonly known as: MYLICON Chew 2 tablets (160 mg total) by mouth 4 (four) times daily for 7 days. What changed:  when to take this reasons to take this   Trelegy Ellipta 100-62.5-25 MCG/ACT Aepb Generic drug: Fluticasone-Umeclidin-Vilant  Inhale 1 puff daily               Durable Medical Equipment  (From admission, onward)           Start     Ordered   11/22/21 1202  For home use only DME Other see comment  Once       Comments: Pulse ox  Question:  Length of Need  Answer:  Lifetime   11/22/21 1201   11/22/21 1136  For home use only DME Tub bench  Once        11/22/21 1136   11/22/21 1136  For home use only DME 3 n 1  Once        11/22/21 1136   11/22/21 1135  For home use only DME 4 wheeled rolling walker with seat  Once       Question:  Patient needs a walker to treat with the following condition  Answer:  Weakness   11/22/21 1136            Discharge Instructions: Please refer to Patient Instructions section of EMR for full details.  Patient was counseled important signs and symptoms that should prompt  return to medical care, changes in medications, dietary instructions, activity restrictions, and follow up appointments.   Follow-Up Appointments:  Follow-up Information     [facility] Follow up.   Specialty: Home Health Services Why: HHPT Contact information: [address] [place] [address] [phone]         Clinic, [facility] .   Contact information: [address] [place] [facility] [place] [address] [phone]         Emilio Aspen, MD Follow up on 11/27/2021.   Specialty: Internal Medicine Why: 11;00 for new patient ,please bring photo id, insurance card and wear a mask. thank you go 15 minutes earlier to  fill out paper work. Contact information: [address], [address] [place] [address] [phone]                 Erick Alley, DO 11/22/2021, 4:08 PM PGY-1, [facility]

## 2021-11-22 NOTE — Progress Notes (Signed)
Occupational Therapy Treatment ?Patient Details ?Name: Helen Vasquez ?MRN: [id number] ?DOB: [birth year] ?Today's Date: 11/22/2021 ? ? ?History of present illness 66 yo female presenting 3/8 with SOB and LE edema after not taking lasix x3 weeks. Pt found to be hypoxic upon arrival, with COPD exacerbation. PMH includes: CHF, COPD, and HTN. ?  ?OT comments ? Pt met goals this session and will dc from OT services at this time. OT without further questions or concerns. Pt has all necessary handouts for AE printed at bedside at this time. OT signing off.   ? ?Recommendations for follow up therapy are one component of a multi-disciplinary discharge planning process, led by the attending physician.  Recommendations may be updated based on patient status, additional functional criteria and insurance authorization. ?   ?Follow Up Recommendations ? No OT follow up  ?  ?Assistance Recommended at Discharge PRN  ?Patient can return home with the following ? Assistance with cooking/housework ?  ?Equipment Recommendations ? Tub/shower bench;Other (comment);BSC/3in1 (rollator,)  ?  ?Recommendations for Other Services   ? ?  ?Precautions / Restrictions Precautions ?Precautions: Other (comment) ?Precaution Comments: Decreased activity tolerance  ? ? ?  ? ?Mobility Bed Mobility ?  ?  ?  ?  ?  ?  ?  ?General bed mobility comments: oob on arrival ?  ? ?Transfers ?Overall transfer level: Modified independent ?  ?  ?  ?  ?  ?  ?  ?  ?  ?  ?  ?Balance   ?  ?  ?  ?  ?  ?  ?  ?  ?  ?  ?  ?  ?  ?  ?  ?  ?  ?  ?   ? ?ADL either performed or assessed with clinical judgement  ? ?ADL Overall ADL's : Needs assistance/impaired ?Eating/Feeding: Independent ?  ?Grooming: Wash/dry hands;Modified independent;Standing ?  ?  ?  ?  ?  ?Upper Body Dressing : Modified independent ?Upper Body Dressing Details (indicate cue type and reason): don gown ?Lower Body Dressing: Modified independent;Sit to/from stand;With adaptive equipment ?Lower Body Dressing Details  (indicate cue type and reason): demo socks shoes pants ?Toilet Transfer: Modified Independent;Regular Toilet;Grab bars ?  ?Toileting- Clothing Manipulation and Hygiene: Modified independent;Sit to/from stand ?  ?  ?  ?Functional mobility during ADLs: Supervision/safety ?  ?  ? ?Extremity/Trunk Assessment Upper Extremity Assessment ?Upper Extremity Assessment: Overall WFL for tasks assessed ?  ?Lower Extremity Assessment ?Lower Extremity Assessment: Overall WFL for tasks assessed ?  ?  ?  ? ?Vision   ?  ?  ?Perception   ?  ?Praxis   ?  ? ?Cognition Arousal/Alertness: Awake/alert ?Behavior During Therapy: [facility] for tasks assessed/performed ?Overall Cognitive Status: Within Functional Limits for tasks assessed ?  ?  ?  ?  ?  ?  ?  ?  ?  ?  ?  ?  ?  ?  ?  ?  ?  ?  ?  ?   ?Exercises   ? ?  ?Shoulder Instructions   ? ? ?  ?General Comments RA - pt with DOE 2 out 4 with bathroom transfer.  ? ? ?Pertinent Vitals/ Pain       Pain Assessment ?Pain Assessment: No/denies pain ? ?Home Living   ?  ?  ?  ?  ?  ?  ?  ?  ?  ?  ?  ?  ?  ?  ?  ?  ?  ?  ? ?  ?  Prior Functioning/Environment    ?  ?  ?  ?   ? ?Frequency ? Min 2X/week  ? ? ? ? ?  ?Progress Toward Goals ? ?OT Goals(current goals can now be found in the care plan section) ? Progress towards OT goals: Goals met/education completed, patient discharged from OT ? ?Acute Rehab OT Goals ?Patient Stated Goal: to return home ?OT Goal Formulation: With patient ?Time For Goal Achievement: 12/05/21 ?Potential to Achieve Goals: Good ?ADL Goals ?Pt Will Perform Lower Body Dressing: with modified independence;with adaptive equipment;sit to/from stand (met) ?Additional ADL Goal #1: Pt will perform ADLs at Mod I level with MIn cues for appropiate rest breaks (met) ?Additional ADL Goal #2: Pt will independently verbalize three energy conservation techniques for ADLs/IADLs (met)  ?Plan Discharge plan remains appropriate   ? ?Co-evaluation ? ? ?   ?  ?  ?  ?  ? ?  ?AM-PAC OT "6 Clicks" Daily  Activity     ?Outcome Measure ? ? Help from another person eating meals?: None ?Help from another person taking care of personal grooming?: None ?Help from another person toileting, which includes using toliet, bedpan, or urinal?: None ?Help from another person bathing (including washing, rinsing, drying)?: None ?Help from another person to put on and taking off regular upper body clothing?: None ?Help from another person to put on and taking off regular lower body clothing?: None ?6 Click Score: 24 ? ?  ?End of Session   ? ?OT Visit Diagnosis: Unsteadiness on feet (R26.81);Other abnormalities of gait and mobility (R26.89);Muscle weakness (generalized) (M62.81) ?  ?Activity Tolerance Patient tolerated treatment well ?  ?Patient Left in chair;with call bell/phone within reach ?  ?Nurse Communication Mobility status;Precautions ?  ? ?   ? ?Time: 1045-1100 ?OT Time Calculation (min): 15 min ? ?Charges: OT General Charges ?$OT Visit: 1 Visit ?OT Treatments ?$Self Care/Home Management : 8-22 mins ? ? ?Brynn, OTR/L  ?Acute Rehabilitation Services ?Pager: [phone] ?Office: [phone] ?. ? ? ?Jeri Modena ?11/22/2021, 11:09 AM ?

## 2021-11-22 NOTE — Plan of Care (Signed)
°  Problem: Education: °Goal: Knowledge of disease or condition will improve °Outcome: Progressing °Goal: Knowledge of the prescribed therapeutic regimen will improve °Outcome: Progressing °Goal: Individualized Educational Video(s) °Outcome: Progressing °  °Problem: Activity: °Goal: Ability to tolerate increased activity will improve °Outcome: Progressing °Goal: Will verbalize the importance of balancing activity with adequate rest periods °Outcome: Progressing °  °Problem: Respiratory: °Goal: Ability to maintain a clear airway will improve °Outcome: Progressing °Goal: Levels of oxygenation will improve °Outcome: Progressing °Goal: Ability to maintain adequate ventilation will improve °Outcome: Progressing °  °Problem: Education: °Goal: Knowledge of General Education information will improve °Description: Including pain rating scale, medication(s)/side effects and non-pharmacologic comfort measures °Outcome: Progressing °  °Problem: Health Behavior/Discharge Planning: °Goal: Ability to manage health-related needs will improve °Outcome: Progressing °  °Problem: Clinical Measurements: °Goal: Ability to maintain clinical measurements within normal limits will improve °Outcome: Progressing °Goal: Will remain free from infection °Outcome: Progressing °Goal: Diagnostic test results will improve °Outcome: Progressing °Goal: Respiratory complications will improve °Outcome: Progressing °Goal: Cardiovascular complication will be avoided °Outcome: Progressing °  °Problem: Activity: °Goal: Risk for activity intolerance will decrease °Outcome: Progressing °  °Problem: Nutrition: °Goal: Adequate nutrition will be maintained °Outcome: Progressing °  °Problem: Coping: °Goal: Level of anxiety will decrease °Outcome: Progressing °  °Problem: Elimination: °Goal: Will not experience complications related to bowel motility °Outcome: Progressing °Goal: Will not experience complications related to urinary retention °Outcome: Progressing °   °Problem: Pain Managment: °Goal: General experience of comfort will improve °Outcome: Progressing °  °Problem: Safety: °Goal: Ability to remain free from injury will improve °Outcome: Progressing °  °Problem: Skin Integrity: °Goal: Risk for impaired skin integrity will decrease °Outcome: Progressing °  °Problem: Education: °Goal: Ability to demonstrate management of disease process will improve °Outcome: Progressing °Goal: Ability to verbalize understanding of medication therapies will improve °Outcome: Progressing °Goal: Individualized Educational Video(s) °Outcome: Progressing °  °Problem: Activity: °Goal: Capacity to carry out activities will improve °Outcome: Progressing °  °Problem: Cardiac: °Goal: Ability to achieve and maintain adequate cardiopulmonary perfusion will improve °Outcome: Progressing °  °

## 2021-11-22 NOTE — Progress Notes (Signed)
Family Medicine Teaching Service ?Daily Progress Note ?Intern Pager: [phone] ? ?Patient name: Sundai Probert Medical record number: [id number] ?Date of birth: 1956-04-06 Age: 66 y.o. Gender: female ? ?Primary Care Provider: [facility] ?Consultants: none ?Code Status: full ? ?Pt Overview and Major Events to Date:  ?Patient is a 65 year old female presenting with shortness of breath.  Past medical history significant for COPD, asthma, hypertension ? ?Assessment and Plan: ? ?Acute hypoxic respiratory failure in the setting of likely COPD exacerbation  asthma ?Patient with known history of COPD and asthma last hospitalized in January for COPD exacerbation.  Yesterday patient was transition to oral prednisone 40 mg, duo nebs were spaced to twice daily and albuterol on every 2 hours as needed which patient has not used.  There is initially some concern due to documentation that patient may have a component of CHF so echocardiogram was ordered which showed ejection fraction of 60 to 65%, normal left ventricular function, no regional wall abnormalities, mild concentric left ventricular hypertrophy and left ventricular diastolic parameters were indeterminate.  Patient states she has no knowledge of a history of CHF and has never taken Lasix as was mistakenly documented in the ED.  ?This exacerbation could be multifactorial, could have a component of medication nonadherence as patient seemed unsure about the difference in the Symbicort inhaler versus albuterol inhaler.  Patient also has asthma and takes several allergy medications, as we are approaching spring allergies could also be a component of this and patient endorses mucus drainage in her throat and sinus pressure.  PT recommends home health PT due to limitations with functional bed mobility, power, stability and endurance.  SPO2 remained above 94% with all activity yesterday with heart rates into the 130s with activity. ?This morning patient is breathing  comfortably on RA with oxygen saturation of 95%. ?-Albuterol every 2 hours as needed ?-Flonase daily ?-Claritin daily ?-Dulera 2 puffs twice daily ?-Incruse Ellipta 1 puff daily ?-Singulair at bedtime ?-Prednisone 40 mg for total of 5 days (today is day 3) ? ?AKI ?Baseline creatinine appears to be 0.7-0.9.  Creatinine is back to baseline today at 0.92 (was 1.28 yesterday). ? ?Hypertension ?Home medication includes telmisartan-hydrochlorothiazide 80-25 mg daily.  Blood pressures have remained normotensive during her hospitalization and antihypertensives have been held. ? ?Lightheadedness  chest pain ?Yesterday patient complained of a brief episode of lightheadedness and a squeezing chest pain which she stated occurs about 3 times a week usually with arm movement.  EKG and trops were negative for MI.  Patient does have history of PACs and PVCs which could be contributing to her lightheadedness.  Will recommend follow-up outpatient for this. ? ?FEN/GI: Regular ?PPx: lovenox ?Dispo:home today ? ?Subjective:  ?Patient states she is feeling well this morning, is ready to go home ? ?Objective: ?Temp:  [97.4 ?F (36.3 ?C)-98.6 ?F (37 ?C)] 98.6 ?F (37 ?C) (03/10 0425) ?Pulse Rate:  [82-104] 82 (03/10 0425) ?Resp:  [16-20] 17 (03/10 0425) ?BP: (110-123)/(62-78) 113/70 (03/10 0425) ?SpO2:  [91 %-96 %] 94 % (03/10 0425) ?Weight:  [101.1 kg] 101.1 kg (03/10 0425) ?Physical Exam: ?General: Patient sitting up in bed, just finishing breakfast, NAD ?Cardiovascular: RRR, tachycardic into the low 100s ?Respiratory: CTAB, normal effort ?Abdomen: Normal bowel sounds, soft, nontender to palpation, nondistended ?Extremities: No edema BLEs ? ?Laboratory: ?Recent Labs  ?Lab 11/20/21 ?1322 11/20/21 ?2113 11/21/21 ?0423  ?WBC 6.8  --  7.1  ?HGB 13.4 13.3 13.5  ?HCT 38.3 39.0 38.3  ?PLT 232  --  239  ? ?Recent Labs  ?Lab 11/20/21 ?1322 11/20/21 ?2113 11/21/21 ?0423 11/22/21 ?0238  ?NA 138 136 136 132*  ?K 3.9 3.6 4.2 4.3  ?CL 100  --  97* 96*   ?CO2 29  --  27 26  ?BUN 11  --  19 21  ?CREATININE 0.87  --  1.28* 0.92  ?CALCIUM 9.4  --  9.9 9.3  ?PROT 6.5  --   --   --   ?BILITOT 1.4*  --   --   --   ?ALKPHOS 60  --   --   --   ?ALT 11  --   --   --   ?AST 23  --   --   --   ?GLUCOSE 89  --  150* 114*  ? ? ? ? ?Erick Alley, DO ?11/22/2021, 5:13 AM ?PGY-1, [facility] ?FPTS Intern pager: [phone], text pages welcome ? ?

## 2021-11-22 NOTE — Discharge Instructions (Addendum)
Dear Helen Vasquez, ? ?Thank you for letting us participate in your care. You were hospitalized for shortness of breath and low oxygen levels and diagnosed with COPD exacerbation. You were treated with steroids, and breathing treatments.  ? ?POST-HOSPITAL & CARE INSTRUCTIONS ?Please stop using your Symbicort inhaler. Instead, start using the new inhaler, Trelegy ellipta, 1 puff every day ?Use your albuterol inhaler as needed for wheezing and shortness of breath ?Take prednisone 40 mg (2 tablets) for two days starting tomorrow ?Hold off on taking you blood pressure medication until you follow up with your primary care provider as your blood pressure was normal during your hospitalization ?Make an appointment with your primary care doctor within one week of discharge ?Go to your follow up appointments (listed below) ? ? ?DOCTOR'S APPOINTMENT   ?Future Appointments  ?Date Time Provider Department Center  ?01/10/2022 12:00 PM [facility] PFT ROOM [facility] None  ?01/10/2022  1:30 PM Luciano Cutter, MD [facility] None  ? ? Follow-up Information   ? ? Care, [facility] Follow up.   ?Specialty: Home Health Services ?Why: HHPT ?Contact information: ?[address] ?[address] ?[place] [address] ?[phone] ? ? ?  ?  ? ?  ?  ? ?  ? ? ?Take care and be well! ? ?Family Medicine Teaching Service Inpatient Team ?[facility]  ?[facility]  ?[address] [place], [place] [address] ?[phone] ? ? ? ?

## 2021-11-22 NOTE — Progress Notes (Unsigned)
Subjective:   PATIENT ID: Helen Vasquez GENDER: female DOB: Feb 19, 1956, MRN: [id number]   HPI  No chief complaint on file.  Reason for Visit: Follow-up shortness of breath  Ms. Helen Vasquez is a 66 year old female with childhood asthma, presumed COPD, hypertension who presents for follow-up.  Initial consult 10/23/21 She was recently hospitalized in December and January for COPD exacerbation with new O2 requirement.  She was treated with steroids nebulizers and antibiotics.  On 10/03/2021 discharge summary which was reviewed, she was discharged on room air with plan for outpatient follow-up. [facility] Dr[facility] Chilton Si on 09/24/21. She was started on Wixela. No PFTs in record. Since discharge she has not been been taking Symbicort due to concern for the steroids. She reports shortness of breath with exertion. She can walk a block but usually has back issues limits her rather than breathing. Denies wheezing or chronic cough. She quit smoking 4 months ago. She had PFTs completed 2 months. At home she reports SpO2 94%. Her symptoms usually worsen after caring for grandchild. She reports occasional muscle tightness.  11/22/21 On her last visit she was started on Symbicort  Social History: Childhood asthma Former 22 pack-years. Quit 06/2021.  Environmental exposures:  Games developer   Past Medical History:  Diagnosis Date   Asthma    COPD (chronic obstructive pulmonary disease) (HCC)    High blood pressure      Family History  Problem Relation Age of Onset   Hypertension Other      Social History   Occupational History   Not on file  Tobacco Use   Smoking status: Former    Packs/day: 0.50    Years: 43.00    Pack years: 21.50    Types: Cigarettes   Smokeless tobacco: Never   Tobacco comments:    States she smoked off and on from 1979 until 2022  Substance and Sexual Activity   Alcohol use: Never   Drug use: Never   Sexual activity: Not on file    Allergies   Allergen Reactions   Bee Venom Anaphylaxis and Hives   Ace Inhibitors Swelling    Reaction(s): Unknown   Penicillin G Hives     Facility-Administered Medications Prior to Visit  Medication Dose Route Frequency Provider Last Rate Last Admin   albuterol (PROVENTIL) (2.5 MG/3ML) 0.083% nebulizer solution 3 mL  3 mL Inhalation Q2H PRN Glenford Bayley, NP       albuterol (PROVENTIL) (2.5 MG/3ML) 0.083% nebulizer solution  10 mg/hr Nebulization Continuous Erick Alley, DO 12 mL/hr at 11/20/21 1549 10 mg/hr at 11/20/21 1549   enoxaparin (LOVENOX) injection 50 mg  50 mg Subcutaneous Q24H Paytes, Austin A, RPH   50 mg at 11/21/21 1809   fluticasone (FLONASE) 50 MCG/ACT nasal spray 2 spray  2 spray Each Nare Daily Erick Alley, DO   2 spray at 11/21/21 1101   guaiFENesin (MUCINEX) 12 hr tablet 600 mg  600 mg Oral BID PRN Park Pope, MD       ipratropium-albuterol (DUONEB) 0.5-2.5 (3) MG/3ML nebulizer solution 3 mL  3 mL Nebulization BID Moses Manners, MD   3 mL at 11/22/21 0759   loratadine (CLARITIN) tablet 10 mg  10 mg Oral Daily Erick Alley, DO   10 mg at 11/21/21 1101   mometasone-formoterol (DULERA) 200-5 MCG/ACT inhaler 2 puff  2 puff Inhalation BID Erick Alley, DO   2 puff at 11/22/21 0759   montelukast (SINGULAIR) tablet 10 mg  10 mg Oral QHS Erick Alley, DO   10 mg at 11/21/21 2120   predniSONE (DELTASONE) tablet 40 mg  40 mg Oral Q breakfast Park Pope, MD   40 mg at 11/22/21 0932   umeclidinium bromide (INCRUSE ELLIPTA) 62.5 MCG/ACT 1 puff  1 puff Inhalation Daily Park Pope, MD   1 puff at 11/22/21 [phone]   Outpatient Medications Prior to Visit  Medication Sig Dispense Refill   acetaminophen (TYLENOL) 325 MG tablet Take 2 tablets (650 mg total) by mouth every 6 (six) hours as needed for mild pain (or Fever >/= 101). (Patient not taking: Reported on 10/23/2021)     albuterol (PROVENTIL) (2.5 MG/3ML) 0.083% nebulizer solution Take 3 mLs (2.5 mg total) by nebulization every 4 (four)  hours as needed for wheezing or shortness of breath. Use 3 times daily x5 days, then every 4 hours as needed. (Patient taking differently: Take 2.5 mg by nebulization every 4 (four) hours as needed for wheezing or shortness of breath.) 125 mL 1   albuterol (VENTOLIN HFA) 108 (90 Base) MCG/ACT inhaler Inhale 2 puffs into the lungs every 6 (six) hours as needed for wheezing or shortness of breath. 8 g 5   aspirin EC 81 MG tablet Take 81 mg by mouth daily. Swallow whole.     budesonide-formoterol (SYMBICORT) 160-4.5 MCG/ACT inhaler Inhale 2 puffs into the lungs in the morning and at bedtime. 10.2 g 5   cholecalciferol (VITAMIN D3) 25 MCG (1000 UNIT) tablet Take 1,000 Units by mouth daily.     fluticasone (FLONASE) 50 MCG/ACT nasal spray Place 2 sprays into both nostrils daily. 11.1 mL 0   guaifenesin (HUMIBID E) 400 MG TABS tablet Take 400 mg by mouth every 6 (six) hours as needed (cough).     loratadine (CLARITIN) 10 MG tablet Take 1 tablet (10 mg total) by mouth daily. 30 tablet 1   losartan-hydrochlorothiazide (HYZAAR) 100-25 MG tablet Take 1 tablet by mouth daily. (Patient not taking: Reported on 11/20/2021)     Misc Natural Products (ELDERBERRY IMMUNE COMPLEX) CHEW Chew 1 capsule by mouth daily.     montelukast (SINGULAIR) 10 MG tablet Take 1 tablet (10 mg total) by mouth at bedtime. 90 tablet 3   Omega-3 1000 MG CAPS Take 1,000 mg by mouth daily.     OVER THE COUNTER MEDICATION Take 1 tablet by mouth daily. [place]     pantoprazole (PROTONIX) 40 MG tablet Take 1 tablet (40 mg total) by mouth daily at 6 (six) AM. 30 tablet 1   polyethylene glycol (MIRALAX / GLYCOLAX) 17 g packet Take 17 g by mouth daily. 30 each 0   potassium chloride (MICRO-K) 10 MEQ CR capsule 10 mEq daily.     senna-docusate (SENOKOT-S) 8.6-50 MG tablet Take 1 tablet by mouth 2 (two) times daily. (Patient taking differently: Take 1 tablet by mouth daily.)     simethicone (MYLICON) 80 MG chewable tablet Chew 2 tablets (160 mg  total) by mouth 4 (four) times daily for 7 days. (Patient taking differently: Chew 160 mg by mouth every 6 (six) hours as needed for flatulence.) 30 tablet 0   telmisartan-hydrochlorothiazide (MICARDIS HCT) 80-25 MG tablet Take 1 tablet by mouth daily.     Vitamin D-Vitamin K (VITAMIN K2-VITAMIN D3 PO) Take 1 capsule by mouth daily.      Review of Systems  Constitutional:  Negative for chills, diaphoresis, fever, malaise/fatigue and weight loss.  HENT:  Negative for congestion, ear pain and sore throat.  Respiratory:  Positive for shortness of breath. Negative for cough, hemoptysis, sputum production and wheezing.   Cardiovascular:  Positive for palpitations. Negative for chest pain and leg swelling.  Gastrointestinal:  Negative for abdominal pain, heartburn and nausea.  Genitourinary:  Negative for frequency.  Musculoskeletal:  Positive for joint pain. Negative for myalgias.  Skin:  Negative for itching and rash.  Neurological:  Negative for dizziness, weakness and headaches.  Endo/Heme/Allergies:  Does not bruise/bleed easily.  Psychiatric/Behavioral:  Negative for depression. The patient is nervous/anxious.     Objective:   There were no vitals filed for this visit.     Physical Exam: General: Well-appearing, no acute distress HENT: [place], AT Eyes: EOMI, no scleral icterus Respiratory: Clear to auscultation bilaterally.  No crackles, wheezing or rales Cardiovascular: RRR, -M/R/G, no JVD Extremities:-Edema,-tenderness Neuro: AAO x4, CNII-XII grossly intact Psych: Normal mood, normal affect  Data Reviewed:  Imaging: CT chest 09/12/2021-bibasilar atelectasis/scarring.  Left upper lobe calcified granuloma.  Mild centrilobular emphysema in upper lobes bilaterally.  Prominent pulmonary trunk suggestive of arterial hypertension.  PFT: None on file  Labs: CBC    Component Value Date/Time   WBC 7.1 11/21/2021 0423   RBC 4.65 11/21/2021 0423   HGB 13.5 11/21/2021 0423   HCT  38.3 11/21/2021 0423   PLT 239 11/21/2021 0423   MCV 82.4 11/21/2021 0423   MCH 29.0 11/21/2021 0423   MCHC 35.2 11/21/2021 0423   RDW 13.9 11/21/2021 0423   LYMPHSABS 2.2 11/20/2021 1322   MONOABS 0.6 11/20/2021 1322   EOSABS 0.5 11/20/2021 1322   BASOSABS 0.0 11/20/2021 1322   Absolute eos 10/01/21 -600     Assessment & Plan:   Discussion: 66 year old female former smoker with childhood asthma, presumed COPD, hypertension who presents with uncontrolled COPD and recent hospitalizations x 2. Peripheral eosiniphil levels suggest potential benefit from ICS/LABA inhaler. Ambulatory O2 with no desaturations. SpO2 nadir 93%. Discussed clinical course and management of COPD/asthma including bronchodilator regimen and action plan for exacerbation.  COPD-asthma overlap - uncontrolled. --START Symbicort 160-4.5 mcg TWO puffs TWICE a day. This is your EVERYDAY inhaler --CONTINUE Albuterol AS NEEDED for shortness or wheezing. This is your RESCUE inhaler during the day --CONTINUE singulair 10 mg ONCE a day. REFILL --ORDER nebulizer --ARRANGE pulmonary function tests  Health Maintenance Immunization History  Administered Date(s) Administered   Td 12/15/2007   CT Lung Screen - Refer at next visit  No orders of the defined types were placed in this encounter.  No orders of the defined types were placed in this encounter.   No follow-ups on file.  I have spent a total time of 45-minutes on the day of the appointment reviewing prior documentation, coordinating care and discussing medical diagnosis and plan with the patient/family. Imaging, labs and tests included in this note have been reviewed and interpreted independently by me.  Emmalou Hunger Mechele CollinJane Brack Shaddock, MD [facility] 11/22/2021 8:40 AM  Office Number [phone]

## 2021-11-22 NOTE — Progress Notes (Signed)
FPTS Interim Night Progress Note ? ?S:Patient sleeping comfortably.  Rounded with primary night RN.  Declined CPAP and now on room air.  No concerns voiced.  No orders required.   ? ?O: ?Today's Vitals  ? 11/21/21 1927 11/21/21 1959 11/21/21 2000 11/22/21 0012  ?BP:  123/72  120/69  ?Pulse: 97 (!) 104  86  ?Resp: 16 20  17   ?Temp:  97.8 ?F (36.6 ?C)  98.3 ?F (36.8 ?C)  ?TempSrc:  Oral  Oral  ?SpO2:  95%  93%  ?Weight:      ?Height:      ?PainSc:   0-No pain   ? ? ? ? ?A/P: ?Continue current management ?Maintain O2 sat >92% ? ? MD ?PGY-3, [facility] ?Service pager [phone]   ?

## 2021-11-22 NOTE — Plan of Care (Signed)
?Problem: Education: ?Goal: Knowledge of disease or condition will improve ?11/22/2021 1732 by Alver Fisher, RN ?Outcome: Adequate for Discharge ?11/22/2021 1639 by Alver Fisher, RN ?Outcome: Progressing ?Goal: Knowledge of the prescribed therapeutic regimen will improve ?11/22/2021 1732 by Alver Fisher, RN ?Outcome: Adequate for Discharge ?11/22/2021 1639 by Alver Fisher, RN ?Outcome: Progressing ?Goal: Individualized Educational Video(s) ?11/22/2021 1732 by Alver Fisher, RN ?Outcome: Adequate for Discharge ?11/22/2021 1639 by Alver Fisher, RN ?Outcome: Progressing ?  ?Problem: Activity: ?Goal: Ability to tolerate increased activity will improve ?11/22/2021 1732 by Alver Fisher, RN ?Outcome: Adequate for Discharge ?11/22/2021 1639 by Alver Fisher, RN ?Outcome: Progressing ?Goal: Will verbalize the importance of balancing activity with adequate rest periods ?11/22/2021 1732 by Alver Fisher, RN ?Outcome: Adequate for Discharge ?11/22/2021 1639 by Alver Fisher, RN ?Outcome: Progressing ?  ?Problem: Respiratory: ?Goal: Ability to maintain a clear airway will improve ?11/22/2021 1732 by Alver Fisher, RN ?Outcome: Adequate for Discharge ?11/22/2021 1639 by Alver Fisher, RN ?Outcome: Progressing ?Goal: Levels of oxygenation will improve ?11/22/2021 1732 by Alver Fisher, RN ?Outcome: Adequate for Discharge ?11/22/2021 1639 by Alver Fisher, RN ?Outcome: Progressing ?Goal: Ability to maintain adequate ventilation will improve ?11/22/2021 1732 by Alver Fisher, RN ?Outcome: Adequate for Discharge ?11/22/2021 1639 by Alver Fisher, RN ?Outcome: Progressing ?  ?Problem: Education: ?Goal: Knowledge of General Education information will improve ?Description: Including pain rating scale, medication(s)/side effects and non-pharmacologic comfort measures ?11/22/2021 1732 by Alver Fisher, RN ?Outcome: Adequate for Discharge ?11/22/2021  1639 by Alver Fisher, RN ?Outcome: Progressing ?  ?Problem: Health Behavior/Discharge Planning: ?Goal: Ability to manage health-related needs will improve ?11/22/2021 1732 by Alver Fisher, RN ?Outcome: Adequate for Discharge ?11/22/2021 1639 by Alver Fisher, RN ?Outcome: Progressing ?  ?Problem: Clinical Measurements: ?Goal: Ability to maintain clinical measurements within normal limits will improve ?11/22/2021 1732 by Alver Fisher, RN ?Outcome: Adequate for Discharge ?11/22/2021 1639 by Alver Fisher, RN ?Outcome: Progressing ?Goal: Will remain free from infection ?11/22/2021 1732 by Alver Fisher, RN ?Outcome: Adequate for Discharge ?11/22/2021 1639 by Alver Fisher, RN ?Outcome: Progressing ?Goal: Diagnostic test results will improve ?11/22/2021 1732 by Alver Fisher, RN ?Outcome: Adequate for Discharge ?11/22/2021 1639 by Alver Fisher, RN ?Outcome: Progressing ?Goal: Respiratory complications will improve ?11/22/2021 1732 by Alver Fisher, RN ?Outcome: Adequate for Discharge ?11/22/2021 1639 by Alver Fisher, RN ?Outcome: Progressing ?Goal: Cardiovascular complication will be avoided ?11/22/2021 1732 by Alver Fisher, RN ?Outcome: Adequate for Discharge ?11/22/2021 1639 by Alver Fisher, RN ?Outcome: Progressing ?  ?Problem: Activity: ?Goal: Risk for activity intolerance will decrease ?11/22/2021 1732 by Alver Fisher, RN ?Outcome: Adequate for Discharge ?11/22/2021 1639 by Alver Fisher, RN ?Outcome: Progressing ?  ?Problem: Nutrition: ?Goal: Adequate nutrition will be maintained ?11/22/2021 1732 by Alver Fisher, RN ?Outcome: Adequate for Discharge ?11/22/2021 1639 by Alver Fisher, RN ?Outcome: Progressing ?  ?Problem: Coping: ?Goal: Level of anxiety will decrease ?11/22/2021 1732 by Alver Fisher, RN ?Outcome: Adequate for Discharge ?11/22/2021 1639 by Alver Fisher, RN ?Outcome: Progressing ?  ?Problem:  Elimination: ?Goal: Will not experience complications related to bowel motility ?11/22/2021 1732 by Alver Fisher, RN ?Outcome: Adequate for Discharge ?11/22/2021 1639 by Alver Fisher, RN ?Outcome: Progressing ?Goal: Will not experience complications related to urinary retention ?11/22/2021 1732 by Alver Fisher, RN ?Outcome: Adequate for Discharge ?11/22/2021 1639 by Alfredo Bach,  Georgianne Fick, RN ?Outcome: Progressing ?  ?Problem: Pain Managment: ?Goal: General experience of comfort will improve ?11/22/2021 1732 by Alver Fisher, RN ?Outcome: Adequate for Discharge ?11/22/2021 1639 by Alver Fisher, RN ?Outcome: Progressing ?  ?Problem: Safety: ?Goal: Ability to remain free from injury will improve ?11/22/2021 1732 by Alver Fisher, RN ?Outcome: Adequate for Discharge ?11/22/2021 1639 by Alver Fisher, RN ?Outcome: Progressing ?  ?Problem: Skin Integrity: ?Goal: Risk for impaired skin integrity will decrease ?11/22/2021 1732 by Alver Fisher, RN ?Outcome: Adequate for Discharge ?11/22/2021 1639 by Alver Fisher, RN ?Outcome: Progressing ?  ?Problem: Education: ?Goal: Ability to demonstrate management of disease process will improve ?11/22/2021 1732 by Alver Fisher, RN ?Outcome: Adequate for Discharge ?11/22/2021 1639 by Alver Fisher, RN ?Outcome: Progressing ?Goal: Ability to verbalize understanding of medication therapies will improve ?11/22/2021 1732 by Alver Fisher, RN ?Outcome: Adequate for Discharge ?11/22/2021 1639 by Alver Fisher, RN ?Outcome: Progressing ?Goal: Individualized Educational Video(s) ?11/22/2021 1732 by Alver Fisher, RN ?Outcome: Adequate for Discharge ?11/22/2021 1639 by Alver Fisher, RN ?Outcome: Progressing ?  ?Problem: Activity: ?Goal: Capacity to carry out activities will improve ?11/22/2021 1732 by Alver Fisher, RN ?Outcome: Adequate for Discharge ?11/22/2021 1639 by Alver Fisher, RN ?Outcome:  Progressing ?  ?Problem: Cardiac: ?Goal: Ability to achieve and maintain adequate cardiopulmonary perfusion will improve ?11/22/2021 1732 by Alver Fisher, RN ?Outcome: Adequate for Discharge ?11/22/2021 1639 by Alver Fisher, RN ?Outcome: Progressing ?  ?

## 2021-11-22 NOTE — TOC Transition Note (Addendum)
Transition of Care (TOC) - CM/SW Discharge Note ? ? ?Patient Details  ?Name: Helen Vasquez ?MRN: [id number] ?Date of Birth: [birth year] ? ?Transition of Care (TOC) CM/SW Contact:  ?Leone Haven, RN ?Phone Number: ?11/22/2021, 12:15 PM ? ? ?Clinical Narrative:    ?NCM spoke with patient at bedside, offered choice for HHPT, she chose [facility],  NCM made referral to [facility] with [employer].  He is able to take referral soc will begin 24 to 48 hrs post dc.  Patient states her cousin [name] will transport her home.  Patient also has community care aide thru the [place] on MWF at 2:30 to 5:30. She states she would like to change companies.  NCM contacted community care they state she will need to call  [phone] .  NCM gave patient this information. NCM faxed the DME needed to [facility] urgent items for discharge to [phone]. Per April in [place] , PCP is Dr. Chilton Si fax is [phone] . CSW is Margorie John pager is [phone] and phone is [phone] ext [phone].  NCM called and left Colin Mulders a message to return call .  Per [facility] notification, they were already notified of admission.   ? ?1:15- NCM received call from [place] at the [place] she states to fax the Ensure Supplement to the PCP , Dr. Chilton Si and to send the DME to her email.  NCM sent DME to brianna's email. Waiting for MD to sign script for Ensure to send to PCP.  Script for ensure faxed to Dr. Chilton Si at [facility]. Patient wants a new PCP, NCM made follow up apt with [name] on AVS. [facility] to supply DME to patient home, and to give auth to [facility] for [facility]. ? ? ?Final next level of care: Home w Home Health Services ?Barriers to Discharge: No Barriers Identified ? ? ?Patient Goals and CMS Choice ?Patient states their goals for this hospitalization and ongoing recovery are:: return home ?CMS Medicare.gov Compare Post Acute Care list provided to:: Patient ?Choice offered to / list presented to : Patient ? ?Discharge Placement ?  ?           ?  ?  ?  ?   ? ?Discharge Plan and Services ?  ?  ?           ?DME Arranged: Tub bench, Walker rolling with seat, Pulse oximeter, 3-N-1 ?DME Agency: [facility], [place] ?  ?  ?  ?HH Arranged: PT ?HH Agency: [facility] ?Date HH Agency Contacted: 11/22/21 ?Time HH Agency Contacted: 1100 ?Representative spoke with at [facility] Agency: Kandee Keen ? ?Social Determinants of Health (SDOH) Interventions ?  ? ? ?Readmission Risk Interventions ?No flowsheet data found. ? ? ? ? ?

## 2021-12-04 ENCOUNTER — Other Ambulatory Visit: Payer: Self-pay

## 2021-12-04 ENCOUNTER — Encounter: Payer: Self-pay | Admitting: Internal Medicine

## 2021-12-04 ENCOUNTER — Ambulatory Visit (INDEPENDENT_AMBULATORY_CARE_PROVIDER_SITE_OTHER): Payer: No Typology Code available for payment source | Admitting: Internal Medicine

## 2021-12-04 VITALS — BP 120/82 | HR 93 | Temp 98.3°F | Ht 63.0 in | Wt 224.6 lb

## 2021-12-04 DIAGNOSIS — J392 Other diseases of pharynx: Secondary | ICD-10-CM | POA: Diagnosis not present

## 2021-12-04 DIAGNOSIS — J441 Chronic obstructive pulmonary disease with (acute) exacerbation: Secondary | ICD-10-CM | POA: Diagnosis not present

## 2021-12-04 DIAGNOSIS — J302 Other seasonal allergic rhinitis: Secondary | ICD-10-CM | POA: Diagnosis not present

## 2021-12-04 DIAGNOSIS — R600 Localized edema: Secondary | ICD-10-CM | POA: Diagnosis not present

## 2021-12-04 LAB — CBC WITH DIFFERENTIAL/PLATELET
Basophils Absolute: 0 10*3/uL (ref 0.0–0.1)
Basophils Relative: 0.4 % (ref 0.0–3.0)
Eosinophils Absolute: 0.3 10*3/uL (ref 0.0–0.7)
Eosinophils Relative: 3.9 % (ref 0.0–5.0)
HCT: 39.8 % (ref 36.0–46.0)
Hemoglobin: 13.3 g/dL (ref 12.0–15.0)
Lymphocytes Relative: 32.1 % (ref 12.0–46.0)
Lymphs Abs: 2.8 10*3/uL (ref 0.7–4.0)
MCHC: 33.4 g/dL (ref 30.0–36.0)
MCV: 84.9 fl (ref 78.0–100.0)
Monocytes Absolute: 0.7 10*3/uL (ref 0.1–1.0)
Monocytes Relative: 7.7 % (ref 3.0–12.0)
Neutro Abs: 4.9 10*3/uL (ref 1.4–7.7)
Neutrophils Relative %: 55.9 % (ref 43.0–77.0)
Platelets: 272 10*3/uL (ref 150.0–400.0)
RBC: 4.69 Mil/uL (ref 3.87–5.11)
RDW: 15.2 % (ref 11.5–15.5)
WBC: 8.7 10*3/uL (ref 4.0–10.5)

## 2021-12-04 MED ORDER — FUROSEMIDE 20 MG PO TABS: 20.0000 mg | ORAL_TABLET | Freq: Every day | ORAL | 0 refills | Status: DC

## 2021-12-04 MED ORDER — BREZTRI AEROSPHERE 160-9-4.8 MCG/ACT IN AERO
2.0000 | INHALATION_SPRAY | Freq: Two times a day (BID) | RESPIRATORY_TRACT | 0 refills | Status: DC
Start: 2021-12-04 — End: 2022-01-10

## 2021-12-04 MED ORDER — TELMISARTAN 80 MG PO TABS: 80.0000 mg | ORAL_TABLET | Freq: Every day | ORAL | 2 refills | Status: DC

## 2021-12-04 NOTE — Patient Instructions (Addendum)
ICD-10-CM   ?1. Pedal edema  R60.0   ?  ?2. COPD exacerbation ([place])  J44.1   ?  ?3. Throat irritation  J39.2   ?  ? ?Pedal edema ? ?- new onset after recent admission for flare up ? ?Plan ? - lasix 20mg  daily x 1 week ? - restart micardis ?- followup with [facility] ? ? ?COPD exacerbation [facility]) - early march 2023 ?Throat irritation ?History of vocal cord surgery in [place] ? ?- resolved post admission but having trelegy related throat dryness ? ?Plan ? = change trelegy to breztri sample 2puff twice daily sheduled ? - check alpha 1 AT phenotyple, cbc with diff, blood IgE  ?- rule out associated allergies ?-do walk test 12/04/2021 ?- keep PFT appt in April 2023 ? ?Followup ?Dr Loanne Drilling per schedule in April 2023 ? ?

## 2021-12-04 NOTE — Progress Notes (Signed)
? ? ?IOV 10/23/21 with Dr Rodman Pickle ? ? ?Subjective:  ? ?PATIENT ID: Helen Vasquez GENDER: female DOB: 1956-09-08, MRN: [id number] ? ? ?HPI ? ?Chief Complaint  ?Patient presents with  ? Consult  ?  SOB about 61mo  ? ?Reason for Visit: New consult for shortness of breath ? ?Ms. Helen Vasquez is a 66 year old female with childhood asthma, presumed COPD, hypertension who presents as a new consult. ? ?She was recently hospitalized in December and January for COPD exacerbation with new O2 requirement.  She was treated with steroids nebulizers and antibiotics.  On 10/03/2021 discharge summary which was reviewed, she was discharged on room air with plan for outpatient follow-up. ? ?[place] records reviewed from PCP Dr. Nyoka Cowden on 09/24/21. She was started on Wixela. No PFTs in record. ? ?Since discharge she has not been been taking Symbicort due to concern for the steroids. She reports shortness of breath with exertion. She can walk a block but usually has back issues limits her rather than breathing. Denies wheezing or chronic cough. She quit smoking 4 months ago. She had PFTs completed 2 months. At home she reports SpO2 94%. Her symptoms usually worsen after caring for grandchild. She reports occasional muscle tightness. ? ?Social History: ?Childhood asthma ?Former 22 pack-years. Quit 06/2021. ? ?Environmental exposures:  ?[place] ? ?I have personally reviewed patient's past medical/family/social history, allergies, current medications. ? ? ?COPD-asthma overlap ?--START Symbicort 160-4.5 mcg TWO puffs TWICE a day. This is your EVERYDAY inhaler ?--CONTINUE Albuterol AS NEEDED for shortness or wheezing. This is your RESCUE inhaler during the day ?--CONTINUE singulair 10 mg ONCE a day. REFILL ?--ORDER nebulizer ?--ARRANGE pulmonary function tests ? ?OV 12/04/2021 ? ?Subjective:  ?Patient ID: Helen Vasquez, female , DOB: 1956-02-19 , age 71 y.o. , MRN: [id number] , ADDRESS: [address] ?[place] [id number] ?PCP  [facility] ?Patient Care Team: ?[facility] as PCP - General ? ?This Provider for this visit: Treatment Team:  ?Attending Provider: Brand Males, MD ? ? ? ?12/04/2021 -   ?Chief Complaint  ?Patient presents with  ? Acute Visit  ?  Patient states that her feet have been swollen for the last week. She has been elevating them on pillows at night. Shortness of breath with exertion.   ? ? ? ?HPI ?Helen Vasquez 66 y.o. -acute visit of this Dr. Rodman Pickle patient.  She used to live in [place] and was patient at the [facility].  I did tell her that I used to work that till 2006.  She says she has a history of COPD and also vocal cord interventions back in [place].  Most recently in February 2023 she is established with Dr. Loanne Drilling.  Symbicort was started.  Diagnosed with COPD-asthma overlap.  Pulmonary function test is pending for April 2023.  However, early March 2023 got admitted for COPD exacerbation.  I reviewed the notes.  X-ray looks fairly clear.  In February 2023 she did have CT angiogram chest that showed clear lung fields and no pulmonary embolism.  According to the notes and the patient she was on BiPAP briefly she was also on oxygen.  At discharge she was discharged without oxygen.  She tells me that preadmission she was never on oxygen at night or in the daytime.  Preadmission she was never on CPAP or BiPAP.  Post admission she is not on oxygen not on CPAP not on BiPAP.  Her wheezing is resolved.  Shortness of breath back to baseline.  However she has 3 complaints resulting in this acute visit ? ?1.  New onset pedal edema.  They stopped her Micardis at discharge.  She feels she needs Lasix.  It is bilateral.  There is no warmth of tenderness.  She feels the dosage and the hydrochlorothiazide is low ? ?2.  She is complaining of throat irritation and some congestion with the Trelegy.  As she is willing to try breast 3 ? ?3.  She is also reporting easy resting Tachycardia  intermittently which she is not understanding why.  However she is only monitoring this post admission.  And the heart rates are always below 110 ? ? ? ?Simple office walk 185 feet x  3 laps goal with forehead probe 12/04/2021 ?   ?O2 used ra   ?Number laps completed avg   ?Comments about pace 2 of 3 laps   ?Resting Pulse Ox/HR 100% and 97/min   ?Final Pulse Ox/HR 97% and 117/min   ?Desaturated </= 88% no   ?Desaturated <= 3% points yes   ?Got Tachycardic >/= 90/min yes   ?Symptoms at end of test Mild dyspnea   ?Miscellaneous comments Stopped at 2 laps   ? ? ? ?ABG ? ? Latest Reference Range & Units 09/13/21 04:30 11/20/21 21:13  ?pH, Arterial 7.35 - 7.45   7.395  ?pCO2 arterial 32 - 48 mmHg  42.3  ?pO2, Arterial 83 - 108 mmHg  111 (H)  ?pH, Ven 7.250 - 7.430  7.387   ?pCO2, Ven 44.0 - 60.0 mmHg 42.6 (L)   ?pO2, Ven 32.0 - 45.0 mmHg 56.5 (H)   ?(H): Data is abnormally high ?(L): Data is abnormally low ? ? Latest Reference Range & Units 08/02/21 16:59 08/12/21 17:04 09/12/21 14:30 09/13/21 04:30 10/01/21 09:16 10/02/21 03:19 10/03/21 03:47 10/29/21 16:26 11/20/21 13:22 11/21/21 04:23 11/22/21 02:38  ?Creatinine 0.44 - 1.00 mg/dL 0.80 1.04 (H) 0.75 0.70 0.79 0.93 0.89 1.01 (H) 0.87 1.28 (H) 0.92  ?(H): Data is abnormally high ? ?CT Chest data Dec 2022 ? ?Narrative & Impression  ?CLINICAL DATA:  Respiratory illness, questionable airspace disease ?on chest x-ray today. ?  ?EXAM: ?CT CHEST WITH CONTRAST ?  ?TECHNIQUE: ?Multidetector CT imaging of the chest was performed during ?intravenous contrast administration. ?  ?CONTRAST:  90mL OMNIPAQUE IOHEXOL 350 MG/ML SOLN ?  ?COMPARISON:  Portable chest today, PA and lateral chest 08/12/2021. ?  ?FINDINGS: ?Cardiovascular: There is a prominent pulmonary trunk 3.5 cm ?indicating arterial hypertension. There is no central embolus. There ?is mild cardiomegaly with a right chamber predominance suggesting ?right heart dysfunction, chronicity indeterminate. ?  ?There are trace  calcifications LAD coronary artery. No pericardial ?effusion is seen and no venous dilatation. There is lipomatous ?hypertrophy of the intra-atrial septum. The aortic root and ?ascending segment are both ectatic both measuring 3.8 cm caliber. ?There are minimal calcifications of the aortic arch. There is ?moderate tortuosity of the descending segment with no aneurysm or ?dissection , normal great vessel branching and opacification. ?  ?Mediastinum/Nodes: No enlarged mediastinal, hilar, or axillary lymph ?nodes. Thyroid gland, trachea, and esophagus demonstrate no ?significant findings. Eventrations noted along both hemidiaphragms. ?  ?Lungs/Pleura: There are atelectatic bands in the lower lobes. There ?are few linear scar-like opacities in both lungs in the mid to lower ?lung fields. No nodule or infiltrate. There is a calcified granuloma ?anteriorly in the left upper lobe. The central airways are clear. ?There are mild centrilobular emphysematous changes lung apices.  No ?pleural effusion or pneumothorax. ?  ?Upper Abdomen: No acute abnormality. There is mild adenomatous ?hyperplasia of both adrenal glands. ?  ?Musculoskeletal: There are multilevel thoracic spine collapsed ?discs, ankylosis across multiple upper to midthoracic spine ?vertebral bodies, and extensive bridging syndesmophytes. Osteopenia. ?  ?IMPRESSION: ?1. No focal pneumonia is evident. COPD with scattered linear ?scar-like opacities and lower lobe atelectatic bands. ?2. Cardiomegaly with the right chamber predominance indicating right ?heart dysfunction, with prominent pulmonary trunk indicating ?arterial hypertension. No central embolus is seen. ?3. Aortic and coronary artery atherosclerosis with descending aortic ?tortuosity, root and ascending segment aortic ectasia, and ?lipomatous hypertrophy of the interatrial septum. ?4. Osteopenia with thoracic spine degenerative changes, bridging ?syndesmophytes and multilevel vertebral body ankylosis. ?  ?   ?Electronically Signed ?  By: Telford Nab M.D. ?  On: 09/12/2021 22:03  ? ? ?No results found. ? ? ? ?PFT ? ?   ? View : No data to display.  ?  ?  ?  ? ? ?Echo 11/21/21 ? ? ?Sonographer Comments: Suboptimal apic

## 2021-12-05 LAB — IGE: IgE (Immunoglobulin E), Serum: 48 kU/L (ref <=114)

## 2021-12-08 ENCOUNTER — Other Ambulatory Visit: Payer: Self-pay | Admitting: Internal Medicine

## 2021-12-09 NOTE — Telephone Encounter (Signed)
Please advise if you are okay with [place] refilling med or if this needs to be deferred to PCP. ?

## 2021-12-13 LAB — ALPHA-1 ANTITRYPSIN PHENOTYPE: A-1 Antitrypsin, Ser: 147 mg/dL (ref 83–199)

## 2021-12-28 ENCOUNTER — Encounter (HOSPITAL_COMMUNITY): Payer: Self-pay

## 2021-12-28 ENCOUNTER — Emergency Department (HOSPITAL_COMMUNITY): Payer: No Typology Code available for payment source

## 2021-12-28 ENCOUNTER — Emergency Department (HOSPITAL_COMMUNITY)
Admission: EM | Admit: 2021-12-28 | Discharge: 2021-12-28 | Disposition: A | Discharge status: Home / Self Care | Payer: No Typology Code available for payment source | Attending: Emergency Medicine | Admitting: Emergency Medicine

## 2021-12-28 ENCOUNTER — Other Ambulatory Visit: Payer: Self-pay

## 2021-12-28 DIAGNOSIS — R0781 Pleurodynia: Secondary | ICD-10-CM | POA: Insufficient documentation

## 2021-12-28 DIAGNOSIS — M7989 Other specified soft tissue disorders: Secondary | ICD-10-CM | POA: Diagnosis not present

## 2021-12-28 DIAGNOSIS — R0602 Shortness of breath: Secondary | ICD-10-CM | POA: Insufficient documentation

## 2021-12-28 DIAGNOSIS — R0601 Orthopnea: Secondary | ICD-10-CM | POA: Diagnosis not present

## 2021-12-28 DIAGNOSIS — R059 Cough, unspecified: Secondary | ICD-10-CM | POA: Diagnosis not present

## 2021-12-28 DIAGNOSIS — R0789 Other chest pain: Secondary | ICD-10-CM

## 2021-12-28 DIAGNOSIS — R079 Chest pain, unspecified: Secondary | ICD-10-CM | POA: Diagnosis present

## 2021-12-28 LAB — CBC WITH DIFFERENTIAL/PLATELET
Abs Immature Granulocytes: 0.02 10*3/uL (ref 0.00–0.07)
Basophils Absolute: 0.1 10*3/uL (ref 0.0–0.1)
Basophils Relative: 1 %
Eosinophils Absolute: 0.3 10*3/uL (ref 0.0–0.5)
Eosinophils Relative: 3 %
HCT: 37.9 % (ref 36.0–46.0)
Hemoglobin: 13.4 g/dL (ref 12.0–15.0)
Immature Granulocytes: 0 %
Lymphocytes Relative: 33 %
Lymphs Abs: 2.4 10*3/uL (ref 0.7–4.0)
MCH: 28.8 pg (ref 26.0–34.0)
MCHC: 35.4 g/dL (ref 30.0–36.0)
MCV: 81.3 fL (ref 80.0–100.0)
Monocytes Absolute: 0.6 10*3/uL (ref 0.1–1.0)
Monocytes Relative: 8 %
Neutro Abs: 3.9 10*3/uL (ref 1.7–7.7)
Neutrophils Relative %: 55 %
Platelets: 253 10*3/uL (ref 150–400)
RBC: 4.66 MIL/uL (ref 3.87–5.11)
RDW: 14 % (ref 11.5–15.5)
WBC: 7.3 10*3/uL (ref 4.0–10.5)
nRBC: 0 % (ref 0.0–0.2)

## 2021-12-28 LAB — BRAIN NATRIURETIC PEPTIDE: B Natriuretic Peptide: 28.1 pg/mL (ref 0.0–100.0)

## 2021-12-28 LAB — TROPONIN I (HIGH SENSITIVITY)
Troponin I (High Sensitivity): 9 ng/L (ref <18)
Troponin I (High Sensitivity): 9 ng/L (ref <18)

## 2021-12-28 LAB — COMPREHENSIVE METABOLIC PANEL
ALT: 13 U/L (ref 0–44)
AST: 17 U/L (ref 15–41)
Albumin: 3.8 g/dL (ref 3.5–5.0)
Alkaline Phosphatase: 61 U/L (ref 38–126)
Anion gap: 8 (ref 5–15)
BUN: 12 mg/dL (ref 8–23)
CO2: 26 mmol/L (ref 22–32)
Calcium: 9.7 mg/dL (ref 8.9–10.3)
Chloride: 105 mmol/L (ref 98–111)
Creatinine, Ser: 0.84 mg/dL (ref 0.44–1.00)
GFR, Estimated: >60 mL/min (ref >60)
Glucose, Bld: 99 mg/dL (ref 70–99)
Potassium: 3.4 mmol/L — ABNORMAL LOW (ref 3.5–5.1)
Sodium: 139 mmol/L (ref 135–145)
Total Bilirubin: 0.7 mg/dL (ref 0.3–1.2)
Total Protein: 6.9 g/dL (ref 6.5–8.1)

## 2021-12-28 MED ORDER — IOHEXOL 350 MG/ML SOLN
80.0000 mL | Freq: Once | INTRAVENOUS | Status: AC | PRN
  Administered 2021-12-28: 80 mL via INTRAVENOUS

## 2021-12-28 MED ORDER — SODIUM CHLORIDE 0.9 % IV SOLN
INTRAVENOUS | Status: DC
  Administered 2021-12-28: 20 mL/h via INTRAVENOUS

## 2021-12-28 MED ORDER — SODIUM CHLORIDE (PF) 0.9 % IJ SOLN
INTRAMUSCULAR | Status: AC
  Filled 2021-12-28: qty 50

## 2021-12-28 NOTE — ED Provider Notes (Addendum)
?[facility] DEPT ?Provider Note ? ? ?CSN: [id number] ?Arrival date & time: 12/28/21  1711 ? ?  ? ?History ? ?Chief Complaint  ?Patient presents with  ? Chest Pain  ? Leg Swelling  ? ? ?Helen Vasquez is a 66 y.o. female. ? ?66 year old female presents with increased shortness of breath as well as chest pain x1 day.  States has had exertional dyspnea as well as orthopnea.  Chest pain described as being sharp in nature and lasting for few seconds.  She has had nonproductive cough without fever or chills.  No vomiting or diarrhea.  Went to urgent care and had a chest x-ray which was concerning for pulmonary edema and patient sent here for further management ? ? ?  ? ?Home Medications ?Prior to Admission medications   ?Medication Sig Start Date End Date Taking? Authorizing Provider  ?albuterol (PROVENTIL) (2.5 MG/3ML) 0.083% nebulizer solution Take 3 mLs (2.5 mg total) by nebulization every 4 (four) hours as needed for wheezing or shortness of breath. Use 3 times daily x5 days, then every 4 hours as needed. ?Patient taking differently: Take 2.5 mg by nebulization every 4 (four) hours as needed for wheezing or shortness of breath. 10/03/21   Eugenie Filler, MD  ?albuterol (VENTOLIN HFA) 108 (90 Base) MCG/ACT inhaler Inhale 2 puffs into the lungs every 6 (six) hours as needed for wheezing or shortness of breath. 10/23/21   Margaretha Seeds, MD  ?Budeson-Glycopyrrol-Formoterol (BREZTRI AEROSPHERE) 160-9-4.8 MCG/ACT AERO Inhale 2 puffs into the lungs 2 (two) times daily. 12/04/21   Brand Males, MD  ?cholecalciferol (VITAMIN D3) 25 MCG (1000 UNIT) tablet Take 1,000 Units by mouth daily.    [provider]  ?fluticasone (FLONASE) 50 MCG/ACT nasal spray Place 2 sprays into both nostrils daily. 10/04/21   Eugenie Filler, MD  ?Fluticasone-Umeclidin-[facility] ELLIPTA) 100-62.5-25 MCG/ACT AEPB Inhale 1 puff daily 11/22/21   Shary Key, DO  ?furosemide (LASIX) 20 MG tablet  Take 1 tablet (20 mg total) by mouth daily for 7 days. 12/04/21 12/11/21  Brand Males, MD  ?guaifenesin (HUMIBID E) 400 MG TABS tablet Take 400 mg by mouth every 6 (six) hours as needed (cough).    [provider]  ?loratadine (CLARITIN) 10 MG tablet Take 1 tablet (10 mg total) by mouth daily. 10/04/21   Eugenie Filler, MD  ?Misc Natural Products [facility] IMMUNE COMPLEX) CHEW Chew 1 capsule by mouth daily.    [provider]  ?montelukast (SINGULAIR) 10 MG tablet Take 1 tablet (10 mg total) by mouth at bedtime. 10/23/21   Margaretha Seeds, MD  ?Omega-3 1000 MG CAPS Take 1,000 mg by mouth daily.    [provider]  ?pantoprazole (PROTONIX) 40 MG tablet Take 1 tablet (40 mg total) by mouth daily at 6 (six) AM. 10/04/21   Eugenie Filler, MD  ?polyethylene glycol (MIRALAX / GLYCOLAX) 17 g packet Take 17 g by mouth daily. 10/03/21   Eugenie Filler, MD  ?predniSONE (DELTASONE) 20 MG tablet Take 2 tablets (40 mg total) by mouth daily with breakfast. 11/23/21   Shary Key, DO  ?senna-docusate (SENOKOT-S) 8.6-50 MG tablet Take 1 tablet by mouth 2 (two) times daily. ?Patient taking differently: Take 1 tablet by mouth daily. 10/03/21   Eugenie Filler, MD  ?simethicone (MYLICON) 80 MG chewable tablet Chew 2 tablets (160 mg total) by mouth 4 (four) times daily for 7 days. ?Patient taking differently: Chew 160 mg by mouth every 6 (  six) hours as needed for flatulence. 10/03/21 11/20/21  Eugenie Filler, MD  ?telmisartan (MICARDIS) 80 MG tablet Take 1 tablet (80 mg total) by mouth daily. 12/04/21   Brand Males, MD  ?   ? ?Allergies    ?Bee venom, Ace inhibitors, and Penicillin g   ? ?Review of Systems   ?Review of Systems  ?All other systems reviewed and are negative. ? ?Physical Exam ?Updated Vital Signs ?BP (!) 158/86 (BP Location: Right Arm)   Pulse 85   Temp 98.1 ?F (36.7 ?C) (Oral)   Resp 16   Ht 1.6 m (5\' 3" )   Wt 102.5 kg   SpO2 98%   BMI 40.03 kg/m?  ?Physical  Exam ?Vitals and nursing note reviewed.  ?Constitutional:   ?   General: She is not in acute distress. ?   Appearance: Normal appearance. She is well-developed. She is not toxic-appearing.  ?HENT:  ?   Head: Normocephalic and atraumatic.  ?Eyes:  ?   General: Lids are normal.  ?   Conjunctiva/sclera: Conjunctivae normal.  ?   Pupils: Pupils are equal, round, and reactive to light.  ?Neck:  ?   Thyroid: No thyroid mass.  ?   Trachea: No tracheal deviation.  ?Cardiovascular:  ?   Rate and Rhythm: Normal rate and regular rhythm.  ?   Heart sounds: Normal heart sounds. No murmur heard. ?  No gallop.  ?Pulmonary:  ?   Breath sounds: No stridor. Examination of the right-upper field reveals decreased breath sounds. Examination of the left-upper field reveals decreased breath sounds. Decreased breath sounds present. No wheezing, rhonchi or rales.  ?Abdominal:  ?   General: There is no distension.  ?   Palpations: Abdomen is soft.  ?   Tenderness: There is no abdominal tenderness. There is no rebound.  ?Musculoskeletal:     ?   General: No tenderness. Normal range of motion.  ?   Cervical back: Normal range of motion and neck supple.  ?Skin: ?   General: Skin is warm and dry.  ?   Findings: No abrasion or rash.  ?Neurological:  ?   Mental Status: She is alert and oriented to person, place, and time. Mental status is at baseline.  ?   GCS: GCS eye subscore is 4. GCS verbal subscore is 5. GCS motor subscore is 6.  ?   Cranial Nerves: No cranial nerve deficit.  ?   Sensory: No sensory deficit.  ?   Motor: Motor function is intact.  ?Psychiatric:     ?   Attention and Perception: Attention normal.     ?   Speech: Speech normal.     ?   Behavior: Behavior normal.  ? ? ?ED Results / Procedures / Treatments   ?Labs ?(all labs ordered are listed, but only abnormal results are displayed) ?Labs Reviewed  ?CBC WITH DIFFERENTIAL/PLATELET  ?BRAIN NATRIURETIC PEPTIDE  ?COMPREHENSIVE METABOLIC PANEL  ?TROPONIN I (HIGH SENSITIVITY)   ? ? ?EKG ?EKG Interpretation ? ?Date/Time:  Saturday December 28 2021 17:19:42 EDT ?Ventricular Rate:  92 ?PR Interval:  180 ?QRS Duration: 85 ?QT Interval:  365 ?QTC Calculation: 452 ?R Axis:   -20 ?Text Interpretation: Sinus rhythm Borderline left axis deviation Low voltage, precordial leads Confirmed by Lacretia Leigh ([id number]) on 12/28/2021 5:54:04 PM ? ?Radiology ?No results found. ? ?Procedures ?Procedures  ? ? ?Medications Ordered in ED ?Medications  ?0.9 %  sodium chloride infusion (20 mL/hr Intravenous New Bag/Given 12/28/21 1735)  ? ? ?  ED Course/ Medical Decision Making/ A&P ?  ?                        ?Medical Decision Making ?Amount and/or Complexity of Data Reviewed ?Labs: ordered. ?Radiology: ordered. ?ECG/medicine tests: ordered. ? ?Risk ?Prescription drug management. ? ? ?Patient is EKG per my interpretation shows normal sinus rhythm.  No signs of acute coronary ischemia.  Chest x-ray per my interpretation showed no acute disease.  Patient had been complaining of being more short of breath as well as having some pleuritic type chest pain.  I suspicion for PE and patient had a CT angio of her chest which was negative for PE or CHF per my review and interpretation.  Patient had 2 negative troponins and negative BNP.  Hemoglobin stable at 13.4.  Patient has a heart score of 2.  Given her 2 negative troponins, her risk of ACS is very low.  Patient be discharged home at this time and will follow with her doctor as needed ? ? ? ? ? ?Final Clinical Impression(s) / ED Diagnoses ?Final diagnoses:  ?None  ? ? ?Rx / DC Orders ?ED Discharge Orders   ? ? None  ? ?  ? ? ?  ?Lacretia Leigh, MD ?12/28/21 2053 ? ?  ?Lacretia Leigh, MD ?12/28/21 2054 ? ?

## 2021-12-28 NOTE — ED Triage Notes (Signed)
Pt bib ems for chest pain. Pt states pain started last night and has been off and on.  Pt was at urgent care when calling ems.  Pt was recently dc'd [facility] w/ rx for lasix but states she was only given a 7 day script. Pt also c/o bilateral swelling ?

## 2022-01-01 ENCOUNTER — Encounter (HOSPITAL_BASED_OUTPATIENT_CLINIC_OR_DEPARTMENT_OTHER): Payer: Self-pay | Admitting: Family Medicine

## 2022-01-01 ENCOUNTER — Ambulatory Visit (INDEPENDENT_AMBULATORY_CARE_PROVIDER_SITE_OTHER): Payer: No Typology Code available for payment source | Admitting: Family Medicine

## 2022-01-01 ENCOUNTER — Encounter: Payer: Self-pay | Admitting: Gastroenterology

## 2022-01-01 VITALS — BP 149/94 | HR 86 | Temp 98.7°F | Ht 63.0 in | Wt 225.0 lb

## 2022-01-01 DIAGNOSIS — R600 Localized edema: Secondary | ICD-10-CM | POA: Diagnosis not present

## 2022-01-01 DIAGNOSIS — I10 Essential (primary) hypertension: Secondary | ICD-10-CM

## 2022-01-01 DIAGNOSIS — J449 Chronic obstructive pulmonary disease, unspecified: Secondary | ICD-10-CM

## 2022-01-01 DIAGNOSIS — R140 Abdominal distension (gaseous): Secondary | ICD-10-CM

## 2022-01-01 NOTE — Patient Instructions (Signed)
Abdominal Bloating When you have abdominal bloating, your abdomen may feel full, tight, or painful. It may also look bigger than normal or swollen (distended). Common causes of abdominal bloating include: Swallowing air. Constipation. Problems digesting food. Eating too much. Irritable bowel syndrome. This is a condition that affects the large intestine. Lactose intolerance. This is an inability to digest lactose, a natural sugar in dairy products. Celiac disease. This is a condition that affects the ability to digest gluten, a protein found in some grains. Gastroparesis. This is a condition that slows down the movement of food in the stomach and small intestine. It is more common in people with diabetes mellitus. Gastroesophageal reflux disease (GERD). This is a condition that makes stomach acid flow back into the esophagus. Urinary retention. This means that the body is holding onto urine, and the bladder cannot be emptied all the way. Follow these instructions at home: Eating and drinking Avoid eating too much. Try not to swallow air while talking or eating. Avoid eating while lying down. Avoid these foods and drinks: Foods that cause gas, such as broccoli, cabbage, cauliflower, and baked beans. Carbonated drinks. Hard candy. Chewing gum. Medicines Take over-the-counter and prescription medicines only as told by your health care provider. Take probiotic medicines. These medicines contain live bacteria or yeasts that can help digestion. Take coated peppermint oil capsules. General instructions Try to exercise regularly. Exercise may help to relieve bloating that is caused by gas and relieve constipation. Keep all follow-up visits. This is important. Contact a health care provider if: You have nausea and vomiting. You have diarrhea. You have abdominal pain. You have unusual weight loss or weight gain. You have severe pain, and medicines do not help. Get help right away if: You  have chest pain. You have trouble breathing. You have shortness of breath. You have trouble urinating. You have darker urine than normal. You have blood in your stools or have dark, tarry stools. These symptoms may represent a serious problem that is an emergency. Do not wait to see if the symptoms will go away. Get medical help right away. Call your local emergency services (911 in the [place] Do not drive yourself to the hospital. Summary Abdominal bloating means that the abdomen is swollen. Common causes of abdominal bloating are swallowing air, constipation, and problems digesting food. Avoid eating too much and avoid swallowing air. Avoid foods that cause gas, carbonated drinks, hard candy, and chewing gum. This information is not intended to replace advice given to you by your health care provider. Make sure you discuss any questions you have with your health care provider. Document Revised: 04/03/2020 Document Reviewed: 04/03/2020 Elsevier Patient Education  2023 Elsevier Inc.  

## 2022-01-02 ENCOUNTER — Other Ambulatory Visit: Payer: Self-pay | Admitting: Internal Medicine

## 2022-01-03 NOTE — Telephone Encounter (Signed)
MR, please advise on refill request. 

## 2022-01-08 NOTE — Assessment & Plan Note (Signed)
Given ongoing symptoms despite use of PPI, will refer her to gastroenterology for further evaluation and recommendations ?

## 2022-01-08 NOTE — Assessment & Plan Note (Signed)
Ongoing issue for patient.  She has had recent evaluation including laboratory evaluation as well as echocardiogram.  Echo was largely within normal limits, at time of having some swelling and dyspnea, chest x-ray was clear ?It does appear that her lower extremity edema is less likely related to her heart, will refer to vascular/vein specialist for further evaluation ?

## 2022-01-08 NOTE — Assessment & Plan Note (Signed)
Blood pressure slightly elevated in office today ?May need to consider additional pharmacotherapy, patient was on additional agents in the past, however was having normal blood pressure readings in the hospital and pharmacotherapy was decreased due to this ?Recommend intermittent monitoring at home, DASH diet ?

## 2022-01-08 NOTE — Assessment & Plan Note (Signed)
Patient with history and clinical presentation to the hospital which is suggestive of underlying COPD ?She is scheduled to have PFTs completed through pulmonology, strongly encouraged her to proceed with this for further evaluation of lung function ?Recommend continuing with inhalers as prescribed per pulmonology ?

## 2022-01-08 NOTE — Progress Notes (Signed)
? ?New Patient Office Visit ? ?Subjective   ? ?Patient ID: Helen Vasquez, female    DOB: [birth year]  Age: 66 y.o. MRN: [id number] ? ?CC: Establish care, recent hospitalizations/ED visits ? ?HPI ?Helen Vasquez presents to establish care.  Patient with past medical history of hypertension, presumed COPD.  Patient did have recent hospitalization a little over 1 month ago and has had subsequent emergency department visit more recently. ?[facility] ? ?Hypertension: Longstanding issue for patient.  During her hospitalization, she did have normal blood pressure readings and thus her medication was held at discharge.  Prior to admission, she was being prescribed telmisartan-hydrochlorothiazide 80-25 mg daily.  It does appear on her current medications that she is being prescribed telmisartan 80 mg alone, no longer has combination pill with hydrochlorothiazide. ? ?COPD: Patient had hospitalization in March 2023 due to acute hypoxic respiratory failure related to COPD exacerbation.  At time of presentation, patient did require supplemental oxygen, eventually requiring BiPAP.  Evaluation was generally unremarkable, responded well to steroid therapy, Dulera, DuoNebs, and was able to be discharged on room air.  She was started on Trelegy Ellipta at discharge.  Since discharge, she was able to establish with pulmonologist.  At recent appointment with pulmonology, patient was switched from Trelegy to [facility].  Due for PFTs this month. ?Patient is a former smoker. ? ?GERD: Currently utilizing pantoprazole for symptoms.  She reports that she has been having some symptoms of abdominal bloating with some associated discomfort.  Despite use of pantoprazole, still having some reflux at times ? ?Patient is originally from [place]. Patient has been living here since December 2020. ? ?Outpatient Encounter Medications as of 01/01/2022  ?Medication Sig  ? albuterol (PROVENTIL) (2.5 MG/3ML) 0.083% nebulizer solution Take 3  mLs (2.5 mg total) by nebulization every 4 (four) hours as needed for wheezing or shortness of breath. Use 3 times daily x5 days, then every 4 hours as needed.  ? albuterol (VENTOLIN HFA) 108 (90 Base) MCG/ACT inhaler Inhale 2 puffs into the lungs every 6 (six) hours as needed for wheezing or shortness of breath.  ? cholecalciferol (VITAMIN D3) 25 MCG (1000 UNIT) tablet Take 1,000 Units by mouth daily.  ? fluticasone (FLONASE) 50 MCG/ACT nasal spray Place 2 sprays into both nostrils daily.  ? loratadine (CLARITIN) 10 MG tablet Take 1 tablet (10 mg total) by mouth daily.  ? Misc Natural Products (ELDERBERRY IMMUNE COMPLEX) CHEW Chew 1 capsule by mouth daily.  ? montelukast (SINGULAIR) 10 MG tablet Take 1 tablet (10 mg total) by mouth at bedtime.  ? Omega-3 1000 MG CAPS Take 1,000 mg by mouth daily.  ? pantoprazole (PROTONIX) 40 MG tablet Take 1 tablet (40 mg total) by mouth daily at 6 (six) AM.  ? polyethylene glycol (MIRALAX / GLYCOLAX) 17 g packet Take 17 g by mouth daily.  ? senna-docusate (SENOKOT-S) 8.6-50 MG tablet Take 1 tablet by mouth 2 (two) times daily. (Patient taking differently: Take 1 tablet by mouth daily.)  ? telmisartan (MICARDIS) 80 MG tablet Take 1 tablet (80 mg total) by mouth daily.  ? Budeson-Glycopyrrol-Formoterol (BREZTRI AEROSPHERE) 160-9-4.8 MCG/ACT AERO Inhale 2 puffs into the lungs 2 (two) times daily. (Patient not taking: Reported on 01/01/2022)  ? Fluticasone-Umeclidin-Vilant (TRELEGY ELLIPTA) 100-62.5-25 MCG/ACT AEPB Inhale 1 puff daily  ? furosemide (LASIX) 20 MG tablet Take 1 tablet (20 mg total) by mouth daily for 7 days.  ? guaifenesin (HUMIBID E) 400 MG TABS tablet Take 400  mg by mouth every 6 (six) hours as needed (cough). (Patient not taking: Reported on 01/01/2022)  ? predniSONE (DELTASONE) 20 MG tablet Take 2 tablets (40 mg total) by mouth daily with breakfast. (Patient not taking: Reported on 01/01/2022)  ? simethicone (MYLICON) 80 MG chewable tablet Chew 2 tablets (160 mg  total) by mouth 4 (four) times daily for 7 days. (Patient taking differently: Chew 160 mg by mouth every 6 (six) hours as needed for flatulence.)  ? ?No facility-administered encounter medications on file as of 01/01/2022.  ? ? ?Past Medical History:  ?Diagnosis Date  ? Asthma   ? COPD (chronic obstructive pulmonary disease) (HCC)   ? High blood pressure   ? ? ?History reviewed. No pertinent surgical history. ? ?Family History  ?Problem Relation Age of Onset  ? Hypertension Other   ? ? ?Social History  ? ?Socioeconomic History  ? Marital status: Single  ?  Spouse name: Not on file  ? Number of children: Not on file  ? Years of education: Not on file  ? Highest education level: Not on file  ?Occupational History  ? Not on file  ?Tobacco Use  ? Smoking status: Former  ?  Packs/day: 0.50  ?  Years: 43.00  ?  Pack years: 21.50  ?  Types: Cigarettes  ?  Quit date: 2022  ?  Years since quitting: 1.3  ? Smokeless tobacco: Never  ? Tobacco comments:  ?  States she smoked off and on from 1979 until 2022  ?Vaping Use  ? Vaping Use: Never used  ?Substance and Sexual Activity  ? Alcohol use: Never  ? Drug use: Never  ? Sexual activity: Not on file  ?Other Topics Concern  ? Not on file  ?Social History Narrative  ? Not on file  ? ?Social Determinants of Health  ? ?Financial Resource Strain: Not on file  ?Food Insecurity: Not on file  ?Transportation Needs: Not on file  ?Physical Activity: Not on file  ?Stress: Not on file  ?Social Connections: Not on file  ?Intimate Partner Violence: Not on file  ? ? ?Objective   ? ?BP (!) 149/94   Pulse 86   Temp 98.7 ?F (37.1 ?C)   Ht 5\' 3"  (1.6 m)   Wt 225 lb (102.1 kg)   SpO2 99%   BMI 39.86 kg/m?  ? ?Physical Exam ? ?66 year old female in no acute distress ?Cardiovascular exam regular rate and rhythm ?Lungs clear to auscultation bilaterally ?Patient with approximately 1+ pitting edema, equal bilaterally ? ?Assessment & Plan:  ? ?Problem List Items Addressed This Visit   ? ?  ?  Cardiovascular and Mediastinum  ? Hypertension - Primary  ?  Blood pressure slightly elevated in office today ?May need to consider additional pharmacotherapy, patient was on additional agents in the past, however was having normal blood pressure readings in the hospital and pharmacotherapy was decreased due to this ?Recommend intermittent monitoring at home, DASH diet ? ?  ?  ?  ? Respiratory  ? COPD (chronic obstructive pulmonary disease) (HCC)  ?  Patient with history and clinical presentation to the hospital which is suggestive of underlying COPD ?She is scheduled to have PFTs completed through pulmonology, strongly encouraged her to proceed with this for further evaluation of lung function ?Recommend continuing with inhalers as prescribed per pulmonology ? ?  ?  ?  ? Other  ? Abdominal bloating  ?  Given ongoing symptoms despite use of PPI, will refer her to  gastroenterology for further evaluation and recommendations ? ?  ?  ? Relevant Orders  ? Ambulatory referral to Gastroenterology  ? Lower extremity edema  ?  Ongoing issue for patient.  She has had recent evaluation including laboratory evaluation as well as echocardiogram.  Echo was largely within normal limits, at time of having some swelling and dyspnea, chest x-ray was clear ?It does appear that her lower extremity edema is less likely related to her heart, will refer to vascular/vein specialist for further evaluation ? ?  ?  ? Relevant Orders  ? Ambulatory referral to Vascular Surgery  ? ?Spent 50 minutes on this patient encounter, including preparation, chart review, face-to-face counseling with patient and coordination of care, and documentation of encounter ? ?Return in about 6 weeks (around 02/12/2022) for Follow up.  ? ?Shaunessy Dobratz J [place], MD ? ? ?

## 2022-01-10 ENCOUNTER — Ambulatory Visit (INDEPENDENT_AMBULATORY_CARE_PROVIDER_SITE_OTHER): Payer: No Typology Code available for payment source | Admitting: Pulmonary Disease

## 2022-01-10 ENCOUNTER — Encounter: Payer: Self-pay | Admitting: Pulmonary Disease

## 2022-01-10 VITALS — BP 140/80 | HR 70 | Ht 63.0 in | Wt 232.0 lb

## 2022-01-10 DIAGNOSIS — J449 Chronic obstructive pulmonary disease, unspecified: Secondary | ICD-10-CM

## 2022-01-10 LAB — PULMONARY FUNCTION TEST
DL/VA % pred: 96 %
DL/VA: 4.04 ml/min/mmHg/L
DLCO cor % pred: 75 %
DLCO cor: 14.36 ml/min/mmHg
DLCO unc % pred: 74 %
DLCO unc: 14.31 ml/min/mmHg
FEF 25-75 Post: 0.83 L/sec
FEF 25-75 Pre: 0.79 L/sec
FEF2575-%Change-Post: 6 %
FEF2575-%Pred-Post: 46 %
FEF2575-%Pred-Pre: 44 %
FEV1-%Change-Post: 1 %
FEV1-%Pred-Post: 65 %
FEV1-%Pred-Pre: 64 %
FEV1-Post: 1.21 L
FEV1-Pre: 1.19 L
FEV1FVC-%Change-Post: 3 %
FEV1FVC-%Pred-Pre: 92 %
FEV6-%Change-Post: 0 %
FEV6-%Pred-Post: 70 %
FEV6-%Pred-Pre: 70 %
FEV6-Post: 1.62 L
FEV6-Pre: 1.62 L
FEV6FVC-%Change-Post: 1 %
FEV6FVC-%Pred-Post: 104 %
FEV6FVC-%Pred-Pre: 102 %
FVC-%Change-Post: -1 %
FVC-%Pred-Post: 68 %
FVC-%Pred-Pre: 69 %
FVC-Post: 1.62 L
FVC-Pre: 1.65 L
Post FEV1/FVC ratio: 75 %
Post FEV6/FVC ratio: 100 %
Pre FEV1/FVC ratio: 73 %
Pre FEV6/FVC Ratio: 98 %
RV % pred: 106 %
RV: 2.19 L
TLC % pred: 78 %
TLC: 3.85 L

## 2022-01-10 MED ORDER — LORATADINE 10 MG PO TABS: 10.0000 mg | ORAL_TABLET | Freq: Every day | ORAL | 3 refills | Status: AC

## 2022-01-10 MED ORDER — TRELEGY ELLIPTA 200-62.5-25 MCG/ACT IN AEPB: 1.0000 | INHALATION_SPRAY | Freq: Every day | RESPIRATORY_TRACT | 5 refills | Status: DC

## 2022-01-10 MED ORDER — ALBUTEROL SULFATE HFA 108 (90 BASE) MCG/ACT IN AERS
2.0000 | INHALATION_SPRAY | Freq: Four times a day (QID) | RESPIRATORY_TRACT | 5 refills | Status: DC | PRN
Start: 2022-01-10 — End: 2022-04-30

## 2022-01-10 NOTE — Patient Instructions (Signed)
COPD-asthma overlap  ?--STOP Breztri ?--START Trelegy 200 ONE puff TWICE a day. This is your EVERYDAY inhaler ?--CONTINUE Albuterol AS NEEDED for shortness or wheezing. This is your RESCUE inhaler during the day ?--CONTINUE singulair 10 mg ONCE a day ?--Avoid environmental triggers ? ?Penicillin allergies ?--Patient reports she tolerated penicillin without issue in the past. Requested this to be removed ? ?Follow-up with me in 3 months ?

## 2022-01-10 NOTE — Progress Notes (Signed)
PFT done today. 

## 2022-01-10 NOTE — Progress Notes (Signed)
? ? ?Subjective:  ? ?PATIENT ID: Helen Vasquez GENDER: female DOB: 10/08/1955, MRN: [id number] ? ? ?HPI ? ?Chief Complaint  ?Patient presents with  ? Follow-up  ?  PFT results  ? ?Reason for Visit: Follow-up ? ?Helen Vasquez is a 66 year old female with childhood asthma, presumed COPD, hypertension who presents for follow-up. ? ?Initial consult ?She was recently hospitalized in December and January for COPD exacerbation with new O2 requirement.  She was treated with steroids nebulizers and antibiotics.  On 10/03/2021 discharge summary which was reviewed, she was discharged on room air with plan for outpatient follow-up. No PFTs in record. ?Since discharge she has not been been taking Symbicort due to concern for the steroids. She reports shortness of breath with exertion. She can walk a block but usually has back issues limits her rather than breathing. Denies wheezing or chronic cough. She quit smoking 4 months ago. She had PFTs completed 2 months. At home she reports SpO2 94%. Her symptoms usually worsen after caring for grandchild. She reports occasional muscle tightness. ? ?01/10/22 ?Since our last visit, she was hospitalized again for COPD exacerbation. Seen by Dr. Marchelle Gearing in acute visit. She was on Trelegy but switched to [facility] due to throat irritation. She reports she is having more productive cough after switching to [place]. Believes her symptoms are exacerbated by mold in the apartment. Other tenants have had to move for the other reasons. Also has back pain worsened by her weight. She uses albuterol at least once daily shortness of breath. No wheezing. ? ?Social History: ?Childhood asthma ?Former 22 pack-years. Quit 06/2021. ? ?Environmental exposures:  ?[employer]el manager - [employer] ? ?Past Medical History:  ?Diagnosis Date  ? Asthma   ? COPD (chronic obstructive pulmonary disease) (HCC)   ? High blood pressure   ?  ? ?Family History  ?Problem Relation Age of Onset  ? Hypertension Other   ?  ? ?Social History   ? ?Occupational History  ? Not on file  ?Tobacco Use  ? Smoking status: Former  ?  Packs/day: 0.50  ?  Years: 43.00  ?  Pack years: 21.50  ?  Types: Cigarettes  ?  Quit date: 2022  ?  Years since quitting: 1.3  ? Smokeless tobacco: Never  ? Tobacco comments:  ?  States she smoked off and on from 1979 until 2022  ?Vaping Use  ? Vaping Use: Never used  ?Substance and Sexual Activity  ? Alcohol use: Never  ? Drug use: Never  ? Sexual activity: Not on file  ? ? ?Allergies  ?Allergen Reactions  ? Bee Venom Anaphylaxis and Hives  ? Ace Inhibitors Swelling  ?  Reaction(s): Unknown  ? Penicillin G Hives  ?  ? ?Outpatient Medications Prior to Visit  ?Medication Sig Dispense Refill  ? albuterol (PROVENTIL) (2.5 MG/3ML) 0.083% nebulizer solution Take 3 mLs (2.5 mg total) by nebulization every 4 (four) hours as needed for wheezing or shortness of breath. Use 3 times daily x5 days, then every 4 hours as needed. 125 mL 1  ? albuterol (VENTOLIN HFA) 108 (90 Base) MCG/ACT inhaler Inhale 2 puffs into the lungs every 6 (six) hours as needed for wheezing or shortness of breath. 8 g 5  ? cholecalciferol (VITAMIN D3) 25 MCG (1000 UNIT) tablet Take 1,000 Units by mouth daily.    ? fluticasone (FLONASE) 50 MCG/ACT nasal spray Place 2 sprays into both nostrils daily. 11.1 mL 0  ? loratadine (CLARITIN) 10 MG tablet Take 1 tablet (10  mg total) by mouth daily. 30 tablet 1  ? Misc Natural Products (ELDERBERRY IMMUNE COMPLEX) CHEW Chew 1 capsule by mouth daily.    ? montelukast (SINGULAIR) 10 MG tablet Take 1 tablet (10 mg total) by mouth at bedtime. 90 tablet 3  ? Omega-3 1000 MG CAPS Take 1,000 mg by mouth daily.    ? telmisartan-hydrochlorothiazide (MICARDIS HCT) 80-25 MG tablet Take 1 tablet by mouth daily.    ? Budeson-Glycopyrrol-Formoterol (BREZTRI AEROSPHERE) 160-9-4.8 MCG/ACT AERO Inhale 2 puffs into the lungs 2 (two) times daily. 10.7 g 0  ? telmisartan (MICARDIS) 80 MG tablet Take 1 tablet (80 mg total) by mouth daily. 30 tablet 2   ? Fluticasone-Umeclidin-Vilant (TRELEGY ELLIPTA) 100-62.5-25 MCG/ACT AEPB Inhale 1 puff daily (Patient not taking: Reported on 01/10/2022) 60 each 0  ? simethicone (MYLICON) 80 MG chewable tablet Chew 2 tablets (160 mg total) by mouth 4 (four) times daily for 7 days. (Patient taking differently: Chew 160 mg by mouth every 6 (six) hours as needed for flatulence.) 30 tablet 0  ? furosemide (LASIX) 20 MG tablet Take 1 tablet (20 mg total) by mouth daily for 7 days. 7 tablet 0  ? guaifenesin (HUMIBID E) 400 MG TABS tablet Take 400 mg by mouth every 6 (six) hours as needed (cough).    ? pantoprazole (PROTONIX) 40 MG tablet Take 1 tablet (40 mg total) by mouth daily at 6 (six) AM. 30 tablet 1  ? polyethylene glycol (MIRALAX / GLYCOLAX) 17 g packet Take 17 g by mouth daily. 30 each 0  ? predniSONE (DELTASONE) 20 MG tablet Take 2 tablets (40 mg total) by mouth daily with breakfast. 4 tablet 0  ? senna-docusate (SENOKOT-S) 8.6-50 MG tablet Take 1 tablet by mouth 2 (two) times daily. (Patient taking differently: Take 1 tablet by mouth daily.)    ? ?No facility-administered medications prior to visit.  ? ? ?Review of Systems  ?Constitutional:  Negative for chills, diaphoresis, fever, malaise/fatigue and weight loss.  ?HENT:  Negative for congestion.   ?Respiratory:  Positive for cough and shortness of breath. Negative for hemoptysis, sputum production and wheezing.   ?Cardiovascular:  Negative for chest pain, palpitations and leg swelling.  ? ? ?Objective:  ? ?Vitals:  ? 01/10/22 1336  ?BP: 140/80  ?Pulse: 70  ?SpO2: 95%  ?Weight: 232 lb (105.2 kg)  ?Height: 5\' 3"  (1.6 m)  ? ?SpO2: 95 % ?O2 Device: None (Room air) ? ?Physical Exam: ?General: Well-appearing, no acute distress ?HENT: [place], AT ?Eyes: EOMI, no scleral icterus ?Respiratory: Clear to auscultation bilaterally.  No crackles, wheezing or rales ?Cardiovascular: RRR, -M/R/G, no JVD ?Extremities:-Edema,-tenderness ?Neuro: AAO x4, CNII-XII grossly intact ?Psych: Normal mood,  normal affect ? ?Data Reviewed: ? ?Imaging: ?CT chest 09/12/2021-bibasilar atelectasis/scarring.  Left upper lobe calcified granuloma.  Mild centrilobular emphysema in upper lobes bilaterally.  Prominent pulmonary trunk suggestive of arterial hypertension. ?CTA 12/28/21 - No PE. Bibasilar atelectasis ? ?PFT: ?01/10/22 ?FVC 1.62 (68%) FEV1 1.21 (65%) Ratio 73  TLC 78% DLCO 74% ?Interpretation: No obstructive defect. Reduced FVC and FEV1 with normal lung volumes. Mildly reduced DLCO. Clinically correlation. ? ?Labs: ?CBC ?   ?Component Value Date/Time  ? WBC 7.3 12/28/2021 1730  ? RBC 4.66 12/28/2021 1730  ? HGB 13.4 12/28/2021 1730  ? HCT 37.9 12/28/2021 1730  ? PLT 253 12/28/2021 1730  ? MCV 81.3 12/28/2021 1730  ? MCH 28.8 12/28/2021 1730  ? MCHC 35.4 12/28/2021 1730  ? RDW 14.0 12/28/2021 1730  ? LYMPHSABS  2.4 12/28/2021 1730  ? MONOABS 0.6 12/28/2021 1730  ? EOSABS 0.3 12/28/2021 1730  ? BASOSABS 0.1 12/28/2021 1730  ? ?Absolute eos  ?10/01/21 -600 ?12/28/21 -300 ? ?Alpha 1-antitrypsin PI*MM - levels wnl ?   ?Assessment & Plan:  ? ?Discussion: ?66 year old female former smoker with childhood asthma, COPD-asthma, HTN who presents for follow-up. Multiple COPD exacerbations and hospitalizations x 3. Reviewed recent labs and PFTs. No obstructive defect while on bronchodilators. We discussed environmental triggers and she will attempt to change apartment complexes in July when her lease is up. Addressed questions regarding risks of mold and testing. No testing available but likely triggering her asthma. Also reviewed her labs and addressed questions regarding normal alpha 1 antitrypisin and normal IgE and elevated eosinophils. ? ?COPD-asthma overlap  ?--STOP Breztri ?--START Trelegy 200 ONE puff TWICE a day. This is your EVERYDAY inhaler ?--CONTINUE Albuterol AS NEEDED for shortness or wheezing. This is your RESCUE inhaler during the day ?--CONTINUE singulair 10 mg ONCE a day ?--Avoid environmental triggers ? ?Penicillin  allergies ?--Patient reports she tolerated penicillin without issue in the past. Requested this to be removed ? ? ?Health Maintenance ?Immunization History  ?Administered Date(s) Administered  ? Td 12/15/18

## 2022-01-27 ENCOUNTER — Ambulatory Visit: Payer: Medicare Other | Admitting: Gastroenterology

## 2022-02-07 ENCOUNTER — Encounter (HOSPITAL_COMMUNITY): Payer: Self-pay

## 2022-02-07 ENCOUNTER — Other Ambulatory Visit: Payer: Self-pay

## 2022-02-07 ENCOUNTER — Emergency Department (HOSPITAL_COMMUNITY): Payer: No Typology Code available for payment source

## 2022-02-07 ENCOUNTER — Emergency Department (HOSPITAL_COMMUNITY)
Admission: EM | Admit: 2022-02-07 | Discharge: 2022-02-07 | Disposition: A | Discharge status: Home / Self Care | Payer: No Typology Code available for payment source | Attending: Emergency Medicine | Admitting: Emergency Medicine

## 2022-02-07 DIAGNOSIS — J449 Chronic obstructive pulmonary disease, unspecified: Secondary | ICD-10-CM | POA: Diagnosis not present

## 2022-02-07 DIAGNOSIS — J45901 Unspecified asthma with (acute) exacerbation: Secondary | ICD-10-CM | POA: Diagnosis not present

## 2022-02-07 DIAGNOSIS — I10 Essential (primary) hypertension: Secondary | ICD-10-CM | POA: Insufficient documentation

## 2022-02-07 DIAGNOSIS — Z7951 Long term (current) use of inhaled steroids: Secondary | ICD-10-CM | POA: Diagnosis not present

## 2022-02-07 DIAGNOSIS — R600 Localized edema: Secondary | ICD-10-CM | POA: Diagnosis not present

## 2022-02-07 DIAGNOSIS — Z79899 Other long term (current) drug therapy: Secondary | ICD-10-CM | POA: Insufficient documentation

## 2022-02-07 DIAGNOSIS — R0602 Shortness of breath: Secondary | ICD-10-CM | POA: Diagnosis present

## 2022-02-07 LAB — BASIC METABOLIC PANEL
Anion gap: 9 (ref 5–15)
BUN: 15 mg/dL (ref 8–23)
CO2: 28 mmol/L (ref 22–32)
Calcium: 9.7 mg/dL (ref 8.9–10.3)
Chloride: 102 mmol/L (ref 98–111)
Creatinine, Ser: 1.01 mg/dL — ABNORMAL HIGH (ref 0.44–1.00)
GFR, Estimated: >60 mL/min (ref >60)
Glucose, Bld: 122 mg/dL — ABNORMAL HIGH (ref 70–99)
Potassium: 3.5 mmol/L (ref 3.5–5.1)
Sodium: 139 mmol/L (ref 135–145)

## 2022-02-07 LAB — CBC
HCT: 39.7 % (ref 36.0–46.0)
Hemoglobin: 14 g/dL (ref 12.0–15.0)
MCH: 28.2 pg (ref 26.0–34.0)
MCHC: 35.3 g/dL (ref 30.0–36.0)
MCV: 80 fL (ref 80.0–100.0)
Platelets: 272 10*3/uL (ref 150–400)
RBC: 4.96 MIL/uL (ref 3.87–5.11)
RDW: 14.5 % (ref 11.5–15.5)
WBC: 9 10*3/uL (ref 4.0–10.5)
nRBC: 0 % (ref 0.0–0.2)

## 2022-02-07 LAB — BRAIN NATRIURETIC PEPTIDE: B Natriuretic Peptide: 20.2 pg/mL (ref 0.0–100.0)

## 2022-02-07 MED ORDER — PREDNISONE 10 MG PO TABS: ORAL_TABLET | ORAL | 0 refills | Status: AC

## 2022-02-07 MED ORDER — GUAIFENESIN ER 1200 MG PO TB12: 1.0000 | ORAL_TABLET | Freq: Two times a day (BID) | ORAL | 0 refills | Status: AC

## 2022-02-07 MED ORDER — IPRATROPIUM-ALBUTEROL 0.5-2.5 (3) MG/3ML IN SOLN
3.0000 mL | Freq: Once | RESPIRATORY_TRACT | Status: AC
  Administered 2022-02-07: 3 mL via RESPIRATORY_TRACT
  Filled 2022-02-07: qty 3

## 2022-02-07 MED ORDER — ALBUTEROL SULFATE HFA 108 (90 BASE) MCG/ACT IN AERS
1.0000 | INHALATION_SPRAY | Freq: Four times a day (QID) | RESPIRATORY_TRACT | Status: DC | PRN
  Filled 2022-02-07: qty 6.7

## 2022-02-07 NOTE — ED Triage Notes (Addendum)
Patient c/o SOB and states a history of COPD and asthma. Patient states she has been having a productive cough with gray sputum. Patient states hr apartment has mold and states the city is working on the situation.  Patient added that she has had bilateral feet swelling x 1 week.

## 2022-02-07 NOTE — ED Provider Notes (Signed)
[facility] DEPT Provider Note   CSN: [id number] Arrival date & time: 02/07/22  1541     History  Chief Complaint  Patient presents with   Shortness of Breath    Helen Vasquez is a 66 y.o. female.   Shortness of Breath Associated symptoms: no fever    Patient has a history of COPD, high blood pressure, asthma and recurrent issues with shortness of breath.  Patient states she has been having issues for a few months.  She has been to the hospital.  She has seen a pulmonary doctor.  Patient's states they believe her symptoms are related to mold in her apartment.  Patient has been coughing up greenish-yellow sputum.  She has been feeling short of breath and has not noticed any improvement with her inhalers.  She has noticed a bit of leg swelling.  She states at 1 point she was told she had heart failure but the last time she was told she did not.  Home Medications Prior to Admission medications   Medication Sig Start Date End Date Taking? Authorizing Provider  Guaifenesin 1200 MG TB12 Take 1 tablet (1,200 mg total) by mouth 2 (two) times daily at 10 AM and 5 PM. 02/07/22  Yes Dorie Rank, MD  predniSONE (DELTASONE) 10 MG tablet Take 4 tablets (40 mg total) by mouth daily with breakfast for 2 days, THEN 3 tablets (30 mg total) daily with breakfast for 2 days, THEN 2 tablets (20 mg total) daily with breakfast for 2 days, THEN 1 tablet (10 mg total) daily with breakfast for 2 days. 02/07/22 02/15/22 Yes Dorie Rank, MD  albuterol (PROVENTIL) (2.5 MG/3ML) 0.083% nebulizer solution Take 3 mLs (2.5 mg total) by nebulization every 4 (four) hours as needed for wheezing or shortness of breath. Use 3 times daily x5 days, then every 4 hours as needed. 10/03/21   Eugenie Filler, MD  albuterol (VENTOLIN HFA) 108 (90 Base) MCG/ACT inhaler Inhale 2 puffs into the lungs every 6 (six) hours as needed for wheezing or shortness of breath. 01/10/22   Margaretha Seeds, MD   cholecalciferol (VITAMIN D3) 25 MCG (1000 UNIT) tablet Take 1,000 Units by mouth daily.    [provider]  fluticasone (FLONASE) 50 MCG/ACT nasal spray Place 2 sprays into both nostrils daily. 10/04/21   Eugenie Filler, MD  Fluticasone-Umeclidin-Vilant (TRELEGY ELLIPTA) 200-62.5-25 MCG/ACT AEPB Inhale 1 puff into the lungs daily. 01/10/22   Margaretha Seeds, MD  loratadine (CLARITIN) 10 MG tablet Take 1 tablet (10 mg total) by mouth daily. 01/10/22   Margaretha Seeds, MD  Misc Natural Products [facility] IMMUNE COMPLEX) CHEW Chew 1 capsule by mouth daily.    [provider]  montelukast (SINGULAIR) 10 MG tablet Take 1 tablet (10 mg total) by mouth at bedtime. 10/23/21   Margaretha Seeds, MD  Omega-3 1000 MG CAPS Take 1,000 mg by mouth daily.    [provider]  simethicone (MYLICON) 80 MG chewable tablet Chew 2 tablets (160 mg total) by mouth 4 (four) times daily for 7 days. Patient taking differently: Chew 160 mg by mouth every 6 (six) hours as needed for flatulence. 10/03/21 11/20/21  Eugenie Filler, MD  telmisartan-hydrochlorothiazide (MICARDIS HCT) 80-25 MG tablet Take 1 tablet by mouth daily. 11/20/21   [provider]      Allergies    Bee venom and Ace inhibitors    Review of Systems   Review of Systems  Constitutional:  Negative  for fever.  Respiratory:  Positive for shortness of breath.    Physical Exam Updated Vital Signs BP (!) 124/107   Pulse 98   Temp 98.2 F (36.8 C) (Oral)   Resp 19   Ht 1.6 m (5\' 3" )   Wt 104.8 kg   SpO2 98%   BMI 40.92 kg/m  Physical Exam Vitals and nursing note reviewed.  Constitutional:      General: She is not in acute distress.    Appearance: She is well-developed.  HENT:     Head: Normocephalic and atraumatic.     Right Ear: External ear normal.     Left Ear: External ear normal.  Eyes:     General: No scleral icterus.       Right eye: No discharge.        Left eye: No discharge.      Conjunctiva/sclera: Conjunctivae normal.  Neck:     Trachea: No tracheal deviation.  Cardiovascular:     Rate and Rhythm: Normal rate and regular rhythm.  Pulmonary:     Effort: Pulmonary effort is normal. No respiratory distress.     Breath sounds: Normal breath sounds. No stridor. No wheezing or rales.  Abdominal:     General: Bowel sounds are normal. There is no distension.     Palpations: Abdomen is soft.     Tenderness: There is no abdominal tenderness. There is no guarding or rebound.  Musculoskeletal:        General: No tenderness or deformity.     Cervical back: Neck supple.     Right lower leg: Edema present.     Left lower leg: Edema present.  Skin:    General: Skin is warm and dry.     Findings: No rash.  Neurological:     General: No focal deficit present.     Mental Status: She is alert.     Cranial Nerves: No cranial nerve deficit (no facial droop, extraocular movements intact, no slurred speech).     Sensory: No sensory deficit.     Motor: No abnormal muscle tone or seizure activity.     Coordination: Coordination normal.  Psychiatric:        Mood and Affect: Mood normal.    ED Results / Procedures / Treatments   Labs (all labs ordered are listed, but only abnormal results are displayed) Labs Reviewed  BASIC METABOLIC PANEL - Abnormal; Notable for the following components:      Result Value   Glucose, Bld 122 (*)    Creatinine, Ser 1.01 (*)    All other components within normal limits  CBC  BRAIN NATRIURETIC PEPTIDE    EKG EKG Interpretation  Date/Time:  Friday Feb 07 2022 16:03:21 EDT Ventricular Rate:  99 PR Interval:  192 QRS Duration: 88 QT Interval:  354 QTC Calculation: 455 R Axis:   -39 Text Interpretation: Sinus tachycardia Ventricular trigeminy Left axis deviation Low voltage, precordial leads Consider anterior infarct Since last tracing rate faster Confirmed by Dorie Rank [phone]) on 02/07/2022 5:34:32 PM  Radiology DG Chest 2  View  Result Date: 02/07/2022 CLINICAL DATA:  Shortness of breath EXAM: CHEST - 2 VIEW COMPARISON:  12/28/2021 FINDINGS: Borderline cardiomegaly. No focal opacity or pleural effusion. No pneumothorax. IMPRESSION: No active cardiopulmonary disease. Electronically Signed   By: Donavan Foil M.D.   On: 02/07/2022 16:49    Procedures Procedures    Medications Ordered in ED Medications  ipratropium-albuterol (DUONEB) 0.5-2.5 (3) MG/3ML nebulizer solution 3  mL (3 mLs Nebulization Given 02/07/22 1622)    ED Course/ Medical Decision Making/ A&P Clinical Course as of 02/07/22 1752  Fri Feb 07, 2022  [id number] Basic metabolic panel(!) Normal [JK]  1733 Brain natriuretic peptide Normal [JK]  1733 CBC Normal [JK]  1733 DG Chest 2 View Chest x-ray images and radiology report reviewed.  No acute findings [JK]    Clinical Course User Index [JK] Dorie Rank, MD                           Medical Decision Making Problems Addressed: Exacerbation of asthma, unspecified asthma severity, unspecified whether persistent: complicated acute illness or injury  Amount and/or Complexity of Data Reviewed External Data Reviewed: radiology and notes.    Details: Prior pulmonary notes reviewed.  Patient had a CT angio last month without evidence of PE Labs: ordered. Decision-making details documented in ED Course. Radiology: ordered and independent interpretation performed. Decision-making details documented in ED Course.  Risk Prescription drug management.   Patient presented to the ED for evaluation of shortness of breath.  Patient has been having recurrent episodes over the last few months.  She has been followed by pulmonary and they feel that symptoms are most likely related to mold exposure in her home.  Patient unfortunately is living in the same situation.  She states she notices black spots come out of her air conditioner.  No signs of pneumonia in the ED.  No signs of CHF.  Patient recently had a CT  angio that did not show any evidence of pulmonary embolism.  I suspect her symptoms are related to her mold exposure and recurrent bronchospasm.  Patient was given a breathing treatment.  She does complain of a lot of mucus.  She is already on guaifenesin 400 but would like a higher dose.  I explained to her that it is the same medication but I am happy to give her the 1200 mg tablets.  We will try a short course of steroids.  Outpatient follow-up with her pulmonologist.        Final Clinical Impression(s) / ED Diagnoses Final diagnoses:  Exacerbation of asthma, unspecified asthma severity, unspecified whether persistent    Rx / DC Orders ED Discharge Orders          Ordered    Guaifenesin 1200 MG TB12  2 times daily        02/07/22 1750    predniSONE (DELTASONE) 10 MG tablet        02/07/22 1750              Dorie Rank, MD 02/07/22 1752

## 2022-02-07 NOTE — Discharge Instructions (Addendum)
Start taking the higher dose of guaifenesin.  Do not take it at the same time as the other prescription.  Take the steroids to help with your symptoms.  Follow-up with your pulmonary doctor next week to be rechecked

## 2022-02-12 ENCOUNTER — Ambulatory Visit (HOSPITAL_BASED_OUTPATIENT_CLINIC_OR_DEPARTMENT_OTHER): Payer: No Typology Code available for payment source | Admitting: Family Medicine

## 2022-03-01 ENCOUNTER — Other Ambulatory Visit: Payer: Self-pay | Admitting: Internal Medicine

## 2022-03-04 ENCOUNTER — Telehealth: Payer: Self-pay | Admitting: Pulmonary Disease

## 2022-03-04 ENCOUNTER — Emergency Department (HOSPITAL_COMMUNITY): Payer: No Typology Code available for payment source

## 2022-03-04 ENCOUNTER — Other Ambulatory Visit: Payer: Self-pay

## 2022-03-04 ENCOUNTER — Emergency Department (HOSPITAL_COMMUNITY)
Admission: EM | Admit: 2022-03-04 | Discharge: 2022-03-04 | Disposition: A | Discharge status: Home / Self Care | Payer: No Typology Code available for payment source | Attending: Emergency Medicine | Admitting: Emergency Medicine

## 2022-03-04 DIAGNOSIS — Z7951 Long term (current) use of inhaled steroids: Secondary | ICD-10-CM | POA: Insufficient documentation

## 2022-03-04 DIAGNOSIS — Z79899 Other long term (current) drug therapy: Secondary | ICD-10-CM | POA: Insufficient documentation

## 2022-03-04 DIAGNOSIS — I10 Essential (primary) hypertension: Secondary | ICD-10-CM | POA: Diagnosis not present

## 2022-03-04 DIAGNOSIS — J189 Pneumonia, unspecified organism: Secondary | ICD-10-CM | POA: Diagnosis not present

## 2022-03-04 DIAGNOSIS — J449 Chronic obstructive pulmonary disease, unspecified: Secondary | ICD-10-CM | POA: Insufficient documentation

## 2022-03-04 DIAGNOSIS — T887XXA Unspecified adverse effect of drug or medicament, initial encounter: Secondary | ICD-10-CM

## 2022-03-04 DIAGNOSIS — R059 Cough, unspecified: Secondary | ICD-10-CM | POA: Diagnosis present

## 2022-03-04 LAB — COMPREHENSIVE METABOLIC PANEL
ALT: 15 U/L (ref 0–44)
AST: 23 U/L (ref 15–41)
Albumin: 3.5 g/dL (ref 3.5–5.0)
Alkaline Phosphatase: 67 U/L (ref 38–126)
Anion gap: 11 (ref 5–15)
BUN: 9 mg/dL (ref 8–23)
CO2: 29 mmol/L (ref 22–32)
Calcium: 9.5 mg/dL (ref 8.9–10.3)
Chloride: 96 mmol/L — ABNORMAL LOW (ref 98–111)
Creatinine, Ser: 0.8 mg/dL (ref 0.44–1.00)
GFR, Estimated: >60 mL/min (ref >60)
Glucose, Bld: 123 mg/dL — ABNORMAL HIGH (ref 70–99)
Potassium: 3.5 mmol/L (ref 3.5–5.1)
Sodium: 136 mmol/L (ref 135–145)
Total Bilirubin: 0.7 mg/dL (ref 0.3–1.2)
Total Protein: 6.6 g/dL (ref 6.5–8.1)

## 2022-03-04 LAB — CBC WITH DIFFERENTIAL/PLATELET
Abs Immature Granulocytes: 0.04 10*3/uL (ref 0.00–0.07)
Basophils Absolute: 0 10*3/uL (ref 0.0–0.1)
Basophils Relative: 1 %
Eosinophils Absolute: 0.2 10*3/uL (ref 0.0–0.5)
Eosinophils Relative: 3 %
HCT: 37.7 % (ref 36.0–46.0)
Hemoglobin: 12.8 g/dL (ref 12.0–15.0)
Immature Granulocytes: 1 %
Lymphocytes Relative: 19 %
Lymphs Abs: 1.6 10*3/uL (ref 0.7–4.0)
MCH: 27.2 pg (ref 26.0–34.0)
MCHC: 34 g/dL (ref 30.0–36.0)
MCV: 80 fL (ref 80.0–100.0)
Monocytes Absolute: 0.7 10*3/uL (ref 0.1–1.0)
Monocytes Relative: 8 %
Neutro Abs: 5.7 10*3/uL (ref 1.7–7.7)
Neutrophils Relative %: 68 %
Platelets: 272 10*3/uL (ref 150–400)
RBC: 4.71 MIL/uL (ref 3.87–5.11)
RDW: 14.5 % (ref 11.5–15.5)
WBC: 8.2 10*3/uL (ref 4.0–10.5)
nRBC: 0 % (ref 0.0–0.2)

## 2022-03-04 MED ORDER — AMOXICILLIN-POT CLAVULANATE 875-125 MG PO TABS
1.0000 | ORAL_TABLET | Freq: Once | ORAL | Status: AC
  Administered 2022-03-04: 1 via ORAL
  Filled 2022-03-04: qty 1

## 2022-03-04 MED ORDER — SODIUM CHLORIDE 0.9 % IV BOLUS
1000.0000 mL | Freq: Once | INTRAVENOUS | Status: AC
  Administered 2022-03-04: 1000 mL via INTRAVENOUS

## 2022-03-04 MED ORDER — ONDANSETRON HCL 4 MG/2ML IJ SOLN
4.0000 mg | Freq: Once | INTRAMUSCULAR | Status: AC
  Administered 2022-03-04: 4 mg via INTRAVENOUS
  Filled 2022-03-04: qty 2

## 2022-03-04 MED ORDER — AMOXICILLIN-POT CLAVULANATE 875-125 MG PO TABS: 1.0000 | ORAL_TABLET | Freq: Two times a day (BID) | ORAL | 0 refills | Status: DC

## 2022-03-04 NOTE — ED Triage Notes (Signed)
Pt BIB [facility] from home. Productive cough over last week, started feeling nauseous after taking cough syrup with codeine last night. Afebrile.

## 2022-03-04 NOTE — Discharge Instructions (Addendum)
You have pneumonia so please take Augmentin twice daily for a week  You also have side effects from your Tylenol with codeine.  Please stop taking that medicine  See your doctor for follow-up in a week  Return to ER if you have worse cough, trouble breathing, fever, vomiting

## 2022-03-04 NOTE — ED Provider Notes (Signed)
[facility] EMERGENCY DEPARTMENT Provider Note   CSN: [id number] Arrival date & time: 03/04/22  1603     History  Chief Complaint  Patient presents with   Nausea    Helen Vasquez is a 66 y.o. female history of hypertension, COPD here presenting with cough and vomiting.  Patient was recently seen in the ED with cough and was diagnosed with COPD exacerbation given the course of steroids.  Patient also was on guaifenesin for cough and did not help.  Patient went to see her doctor at [facility] yesterday.  She was given a shot of antibiotics and was prescribed Tylenol with codeine.  She states that since then, she started vomiting.  Patient states that she still is coughing and just keeps coughing up clear sputum right now.  Denies any fevers  The history is provided by the patient.       Home Medications Prior to Admission medications   Medication Sig Start Date End Date Taking? Authorizing Provider  albuterol (PROVENTIL) (2.5 MG/3ML) 0.083% nebulizer solution Take 3 mLs (2.5 mg total) by nebulization every 4 (four) hours as needed for wheezing or shortness of breath. Use 3 times daily x5 days, then every 4 hours as needed. 10/03/21   Rodolph Bong, MD  albuterol (VENTOLIN HFA) 108 (90 Base) MCG/ACT inhaler Inhale 2 puffs into the lungs every 6 (six) hours as needed for wheezing or shortness of breath. 01/10/22   Luciano Cutter, MD  cholecalciferol (VITAMIN D3) 25 MCG (1000 UNIT) tablet Take 1,000 Units by mouth daily.    [provider]  fluticasone (FLONASE) 50 MCG/ACT nasal spray Place 2 sprays into both nostrils daily. 10/04/21   Rodolph Bong, MD  Fluticasone-Umeclidin-Vilant (TRELEGY ELLIPTA) 200-62.5-25 MCG/ACT AEPB Inhale 1 puff into the lungs daily. 01/10/22   Luciano Cutter, MD  Guaifenesin 1200 MG TB12 Take 1 tablet (1,200 mg total) by mouth 2 (two) times daily at 10 AM and 5 PM. 02/07/22   Linwood Dibbles, MD  loratadine (CLARITIN) 10 MG  tablet Take 1 tablet (10 mg total) by mouth daily. 01/10/22   Luciano Cutter, MD  Misc Natural Products [facility] IMMUNE COMPLEX) CHEW Chew 1 capsule by mouth daily.    [provider]  montelukast (SINGULAIR) 10 MG tablet Take 1 tablet (10 mg total) by mouth at bedtime. 10/23/21   Luciano Cutter, MD  Omega-3 1000 MG CAPS Take 1,000 mg by mouth daily.    [provider]  simethicone (MYLICON) 80 MG chewable tablet Chew 2 tablets (160 mg total) by mouth 4 (four) times daily for 7 days. Patient taking differently: Chew 160 mg by mouth every 6 (six) hours as needed for flatulence. 10/03/21 11/20/21  Rodolph Bong, MD  telmisartan-hydrochlorothiazide (MICARDIS HCT) 80-25 MG tablet Take 1 tablet by mouth daily. 11/20/21   [provider]      Allergies    Bee venom and Ace inhibitors    Review of Systems   Review of Systems  Gastrointestinal:  Positive for vomiting.  All other systems reviewed and are negative.   Physical Exam Updated Vital Signs BP (!) 137/92 (BP Location: Left Arm)   Pulse 87   Temp 97.8 F (36.6 C) (Oral)   Resp 20   SpO2 98%  Physical Exam Vitals and nursing note reviewed.  Constitutional:      Appearance: Normal appearance.  HENT:     Head: Normocephalic.     Nose: Nose normal.  Mouth/Throat:     Mouth: Mucous membranes are dry.  Eyes:     Extraocular Movements: Extraocular movements intact.     Pupils: Pupils are equal, round, and reactive to light.  Cardiovascular:     Rate and Rhythm: Normal rate and regular rhythm.     Pulses: Normal pulses.     Heart sounds: Normal heart sounds.  Pulmonary:     Comments: Diminished bilateral bases  Abdominal:     General: Abdomen is flat.     Palpations: Abdomen is soft.  Musculoskeletal:        General: Normal range of motion.     Cervical back: Normal range of motion and neck supple.  Skin:    General: Skin is warm.     Capillary Refill: Capillary refill takes less than 2  seconds.  Neurological:     General: No focal deficit present.     Mental Status: She is alert and oriented to person, place, and time.  Psychiatric:        Mood and Affect: Mood normal.        Behavior: Behavior normal.     ED Results / Procedures / Treatments   Labs (all labs ordered are listed, but only abnormal results are displayed) Labs Reviewed  CBC WITH DIFFERENTIAL/PLATELET  COMPREHENSIVE METABOLIC PANEL    EKG EKG Interpretation  Date/Time:  Tuesday March 04 2022 16:07:34 EDT Ventricular Rate:  87 PR Interval:  183 QRS Duration: 94 QT Interval:  372 QTC Calculation: 448 R Axis:   -31 Text Interpretation: Sinus rhythm Ventricular premature complex Left axis deviation No significant change since last tracing Confirmed by Richardean Canal [phone]) on 03/04/2022 4:12:01 PM  Radiology No results found.  Procedures Procedures    Medications Ordered in ED Medications  ondansetron (ZOFRAN) injection 4 mg (has no administration in time range)  sodium chloride 0.9 % bolus 1,000 mL (has no administration in time range)    ED Course/ Medical Decision Making/ A&P                           Medical Decision Making Helen Vasquez is a 66 y.o. female here with cough, vomiting. Patient was started on Tylenol with codeine and likely had side effects from the medication.  Also has recent COPD exacerbation.  Patient was given antibiotic shot yesterday in the clinic but was not discharged with antibiotic.  Consider COPD versus pneumonia versus side effect of medication. Plan to get cbc, cmp, CXR. Will give zofran and hydrate and reassess.   6:33 PM I reviewed patient's labs and independently reviewed her chest x-ray.  CBC and CMP unremarkable.  Chest x-ray showed possible atelectasis versus pneumonia.  Given that she has been having productive cough with yellow and greenish sputum, I we will prescribe some Augmentin.  Of note, I think her nausea is likely side effect of her Tylenol with  codeine.  I told her to stop taking that.  Told her to follow-up with her doctor  Problems Addressed: Community acquired pneumonia of right lower lobe of lung: acute illness or injury Medication side effect: acute illness or injury  Amount and/or Complexity of Data Reviewed Labs: ordered. Decision-making details documented in ED Course. Radiology: ordered and independent interpretation performed. Decision-making details documented in ED Course.  Risk Prescription drug management.    Final Clinical Impression(s) / ED Diagnoses Final diagnoses:  None    Rx / DC Orders ED Discharge Orders  None         Charlynne Pander, MD 03/04/22 [phone]

## 2022-03-04 NOTE — Telephone Encounter (Signed)
Spoke with pt who states that her productive cough has increased greatly since starting Trelegy. She states that 1 week Pt states that she has coughed so much that her sides are hurting and she feels like the "powder is building up in the back of her throat." Pt did state that recently she has completed pred taper and antibiotics recently but those did not seem to help. Pt stated she was given cough syrup that made her throw up. Pt denies fever/ chills/ or any other GI upset. Dr. Everardo All please advise.

## 2022-03-04 NOTE — ED Notes (Signed)
Pt transported to xray 

## 2022-03-04 NOTE — Telephone Encounter (Signed)
Called patient and left voicemail in regards to her trelegy inhaler and some side effects she is having.

## 2022-03-05 ENCOUNTER — Ambulatory Visit: Payer: Self-pay | Admitting: *Deleted

## 2022-03-05 ENCOUNTER — Telehealth: Payer: Self-pay | Admitting: Surgery

## 2022-03-05 NOTE — Telephone Encounter (Signed)
  Chief Complaint: cough causing side pain Symptoms: side pain Frequency: a lot Pertinent Negatives: Patient denies fever Disposition: [] ED /[] Urgent Care (no appt availability in office) / [] Appointment(In office/virtual)/ []  [facility]/ [] Home Care/ [] Refused Recommended Disposition /[] [facility]/ [x]  Follow-up with PCP Additional Notes: Community line pt who was seen in ED last night. She was told to stop taking her cough med with codeine in it because it was making her vomit. He did not order her another cough med and her sides hurt so bad she would like him to call in a cough med to . He is not on duty tonight.  Reason for Disposition  Cough  Answer Assessment - Initial Assessment Questions 1. ONSET: "When did the cough begin?"      Over a week 2. SEVERITY: "How bad is the cough today?"      Frequent and hurts sides 3. SPUTUM: "Describe the color of your sputum" (none, dry cough; clear, white, yellow, green)     Clear white 4. HEMOPTYSIS: "Are you coughing up any blood?" If so ask: "How much?" (flecks, streaks, tablespoons, etc.)     no 5. DIFFICULTY BREATHING: "Are you having difficulty breathing?" If Yes, ask: "How bad is it?" (e.g., mild, moderate, severe)    - MILD: No SOB at rest, mild SOB with walking, speaks normally in sentences, can lie down, no retractions, pulse < 100.    - MODERATE: SOB at rest, SOB with minimal exertion and prefers to sit, cannot lie down flat, speaks in phrases, mild retractions, audible wheezing, pulse 100-120.    - SEVERE: Very SOB at rest, speaks in single words, struggling to breathe, sitting hunched forward, retractions, pulse > 120      no 6. FEVER: "Do you have a fever?" If Yes, ask: "What is your temperature, how was it measured, and when did it start?"     no  8. LUNG HISTORY: "Do you have any history of lung disease?"  (e.g., pulmonary embolus, asthma, COPD Asthma 9. PE RISK FACTORS: "Do  you have a history of blood clots?" (or: recent major surgery, recent prolonged travel, bedridden)     no 10. OTHER SYMPTOMS: "Do you have any other symptoms?" (e.g., runny nose, wheezing, chest pain)       no 11. PREGNANCY: "Is there any chance you are pregnant?" "When was your last menstrual period?"       na 12. TRAVEL: "Have you traveled out of the country in the last month?" (e.g., travel history, exposures)       No  Protocols used: Cough - Acute Productive-A-AH

## 2022-03-05 NOTE — Telephone Encounter (Signed)
ED RNCM contacted patient concerning new prescription for Tessalon pearls 100 mg TID prn. Patient request prescription be called into [facility] on [address] because they deliver as patient does not have transportation. Prescription called in, patient updated. No further ED RN Care management needs identified.

## 2022-03-06 MED ORDER — BREZTRI AEROSPHERE 160-9-4.8 MCG/ACT IN AERO: 2.0000 | INHALATION_SPRAY | Freq: Two times a day (BID) | RESPIRATORY_TRACT | 5 refills | Status: DC

## 2022-03-06 NOTE — Telephone Encounter (Signed)
She called on 03/04/22 to the Pulmonary office due to intolerance to Trelegy. She was seen later the day in the ED for productive cough and prescribed Augmentin for possible pneumonia. She was advised to stop cough syrup as suspected codeine was the culprit of her nausea.  She reports her cough was better on Breztri compared to Trelegy. Will switch to [employer]. Orders placed. I advised patient to call office if symptoms worsen for appointment with NP or physician.

## 2022-04-23 ENCOUNTER — Telehealth: Payer: Self-pay | Admitting: Pulmonary Disease

## 2022-04-23 NOTE — Telephone Encounter (Signed)
Called patient to schedule follow up and she requested a doctors letter to be able to be release from her lease at [facility] on [address] due to the breathing issues she has had there. Patient would like to pick up letter.   Please advise.

## 2022-04-25 NOTE — Telephone Encounter (Signed)
ATC LVMTCB x 1   Pt has scheduled OV with Everardo All on 04/30/22 in which this topic can dicussed at that time.

## 2022-04-29 NOTE — Telephone Encounter (Signed)
Providers are going to have to evaluate patient first. We are unable to write letter stating for her to be released from [facility]. She will be evaluated tomorrow by Buelah Manis. Nothing further needed

## 2022-04-30 ENCOUNTER — Ambulatory Visit (INDEPENDENT_AMBULATORY_CARE_PROVIDER_SITE_OTHER): Payer: No Typology Code available for payment source

## 2022-04-30 ENCOUNTER — Encounter: Payer: Self-pay | Admitting: Primary Care

## 2022-04-30 ENCOUNTER — Ambulatory Visit (INDEPENDENT_AMBULATORY_CARE_PROVIDER_SITE_OTHER): Payer: No Typology Code available for payment source | Admitting: Primary Care

## 2022-04-30 VITALS — BP 122/64 | HR 79 | Temp 98.0°F | Ht 63.0 in | Wt 229.0 lb

## 2022-04-30 DIAGNOSIS — J449 Chronic obstructive pulmonary disease, unspecified: Secondary | ICD-10-CM

## 2022-04-30 MED ORDER — ALBUTEROL SULFATE (2.5 MG/3ML) 0.083% IN NEBU
2.5000 mg | INHALATION_SOLUTION | RESPIRATORY_TRACT | 1 refills | Status: AC | PRN
Start: 2022-04-30 — End: ?

## 2022-04-30 MED ORDER — DOXYCYCLINE HYCLATE 100 MG PO TABS: 100.0000 mg | ORAL_TABLET | Freq: Two times a day (BID) | ORAL | 0 refills | Status: AC

## 2022-04-30 MED ORDER — BREZTRI AEROSPHERE 160-9-4.8 MCG/ACT IN AERO: 2.0000 | INHALATION_SPRAY | Freq: Two times a day (BID) | RESPIRATORY_TRACT | 5 refills | Status: AC

## 2022-04-30 MED ORDER — ALBUTEROL SULFATE HFA 108 (90 BASE) MCG/ACT IN AERS: 2.0000 | INHALATION_SPRAY | Freq: Four times a day (QID) | RESPIRATORY_TRACT | 2 refills | Status: AC | PRN

## 2022-04-30 MED ORDER — BREZTRI AEROSPHERE 160-9-4.8 MCG/ACT IN AERO: 2.0000 | INHALATION_SPRAY | Freq: Two times a day (BID) | RESPIRATORY_TRACT | 0 refills | Status: AC

## 2022-04-30 MED ORDER — MONTELUKAST SODIUM 10 MG PO TABS: 10.0000 mg | ORAL_TABLET | Freq: Every day | ORAL | 3 refills | Status: AC

## 2022-04-30 MED ORDER — PREDNISONE 10 MG PO TABS: ORAL_TABLET | ORAL | 0 refills | Status: AC

## 2022-04-30 MED ORDER — IPRATROPIUM-ALBUTEROL 0.5-2.5 (3) MG/3ML IN SOLN: 3.0000 mL | Freq: Four times a day (QID) | RESPIRATORY_TRACT | Status: AC | PRN

## 2022-04-30 NOTE — Patient Instructions (Addendum)
Recommendations - Continue Breztri 2 puffs morning and evening - Use albuterol rescue inhaler or nebulizer every 4-6 hours as needed for breakthrough shortness of breath or wheezing - Take Mucinex 600 mg twice daily x5 to 7 days loosen congestion  Rx: - Doxycycline 100 mg twice daily x7 days - Prednisone 20 mg x 5 days, 10 mg x 5 days then stop - Refilled Breztri and albuterol  Orders - Chest x-ray today  Follow-up: - 3 months with Dr. Everardo All or sooner if symptoms worsen or return

## 2022-04-30 NOTE — Assessment & Plan Note (Addendum)
-   COPD symptoms were stable up until 2 days ago. She had covid 3 weeks ago and was treated with antiviral but fully recovered.  She is currently in the middle of a mild exacerbation. Lung sounds were diminished on exam. VSS.  Received ipratropium-albuterol nebulizer in office x1.  We will check a chest x-ray today.  Sending in a course of Doxycyline 100mg  BID x 7 days and prednisone 20 mg x 5 days, 10 mg x 5 days.  We will also refill Breztri Aerosphere, Singulair and albuterol. Advised she take Mucinex 600 mg twice daily for the next 5 to 7 days. FU in 3 motnhs with Dr. or sooner if needed.

## 2022-04-30 NOTE — Progress Notes (Signed)
@Patient  ID: , female    DOB: 1956/08/30, 66 y.o.   MRN: [id number]  Chief Complaint  Patient presents with   Follow-up    Pt states she has  some difficulty breathing x 2 days    Referring provider: Clinic, [id number]  HPI: 66 year old female, smoker quit in 2022 (21-pack-year history).  Past medical history significant for hypertension, COPD, seasonal allergies.  Patient of Dr. 2023, last seen on 01/10/2022.  04/30/2022 Patient presents today for month follow-up.  At last visit she was switched from [facility] to Trelegy 200 but reported increased mucus production and worsening cough on new inhaler. She was ultimately switched back to [place]. Patient developed increased shortness of breath symptoms 2 days ago.  Prior to this she tells me she was outside Sunday and Monday evening with her granddaughter for 30 to 35 minutes before symptoms came on.  The next morning she woke up and her breathing was worse.  She has a productive cough with some purulence.  No wheezing or chest tightness.  She was positive for COVID 3 weeks ago and treated with an antiviral.  She tells me that she had a normal chest x-ray at that time.  Her symptoms fully recovered within 5 days.  She has been using Breztri inhaler as prescribed. Needs refills of her inhalers.   Allergies  Allergen Reactions   Bee Venom Anaphylaxis and Hives   Ace Inhibitors Swelling    Reaction(s): Unknown    Immunization History  Administered Date(s) Administered   Td 12/15/2007    Past Medical History:  Diagnosis Date   Asthma    COPD (chronic obstructive pulmonary disease) (HCC)    High blood pressure     Tobacco History: Social History   Tobacco Use  Smoking Status Former   Packs/day: 0.50   Years: 43.00   Total pack years: 21.50   Types: Cigarettes   Quit date: 2022   Years since quitting: 1.6  Smokeless Tobacco Never  Tobacco Comments   States she smoked off and on from 1979 until 2022    Counseling given: Not Answered Tobacco comments: States she smoked off and on from 1979 until 2022   Outpatient Medications Prior to Visit  Medication Sig Dispense Refill   albuterol (PROVENTIL) (2.5 MG/3ML) 0.083% nebulizer solution Take 3 mLs (2.5 mg total) by nebulization every 4 (four) hours as needed for wheezing or shortness of breath. Use 3 times daily x5 days, then every 4 hours as needed. 125 mL 1   Budeson-Glycopyrrol-Formoterol (BREZTRI AEROSPHERE) 160-9-4.8 MCG/ACT AERO Inhale 2 puffs into the lungs in the morning and at bedtime. 10.7 g 5   cholecalciferol (VITAMIN D3) 25 MCG (1000 UNIT) tablet Take 1,000 Units by mouth daily.     fluticasone (FLONASE) 50 MCG/ACT nasal spray Place 2 sprays into both nostrils daily. 11.1 mL 0   Guaifenesin 1200 MG TB12 Take 1 tablet (1,200 mg total) by mouth 2 (two) times daily at 10 AM and 5 PM. 20 tablet 0   loratadine (CLARITIN) 10 MG tablet Take 1 tablet (10 mg total) by mouth daily. 90 tablet 3   telmisartan-hydrochlorothiazide (MICARDIS HCT) 80-25 MG tablet Take 1 tablet by mouth daily.     albuterol (VENTOLIN HFA) 108 (90 Base) MCG/ACT inhaler Inhale 2 puffs into the lungs every 6 (six) hours as needed for wheezing or shortness of breath. (Patient not taking: Reported on 04/30/2022) 8 g 5   montelukast (SINGULAIR) 10 MG tablet Take 1 tablet (10  mg total) by mouth at bedtime. (Patient not taking: Reported on 04/30/2022) 90 tablet 3   Omega-3 1000 MG CAPS Take 1,000 mg by mouth daily. (Patient not taking: Reported on 04/30/2022)     amoxicillin-clavulanate (AUGMENTIN) 875-125 MG tablet Take 1 tablet by mouth every 12 (twelve) hours. 14 tablet 0   Misc Natural Products (ELDERBERRY IMMUNE COMPLEX) CHEW Chew 1 capsule by mouth daily.     simethicone (MYLICON) 80 MG chewable tablet Chew 2 tablets (160 mg total) by mouth 4 (four) times daily for 7 days. (Patient taking differently: Chew 160 mg by mouth every 6 (six) hours as needed for flatulence.) 30  tablet 0   No facility-administered medications prior to visit.    Review of Systems  Review of Systems  Constitutional: Negative.   HENT:  Positive for congestion.   Respiratory:  Positive for cough and shortness of breath. Negative for chest tightness and wheezing.     Physical Exam  BP 122/64 (BP Location: Right Arm, Patient Position: Sitting, Cuff Size: Large)   Pulse 79   Temp 98 F (36.7 C) (Oral)   Ht 5\' 3"  (1.6 m)   Wt 229 lb (103.9 kg)   SpO2 96%   BMI 40.57 kg/m  Physical Exam Constitutional:      Appearance: Normal appearance.  HENT:     Head: Normocephalic and atraumatic.     Mouth/Throat:     Mouth: Mucous membranes are moist.     Pharynx: Oropharynx is clear.  Cardiovascular:     Rate and Rhythm: Normal rate and regular rhythm.     Comments: Trace edema  Pulmonary:     Effort: Pulmonary effort is normal.     Breath sounds: Normal breath sounds. No wheezing, rhonchi or rales.     Comments: Diminished  Musculoskeletal:        General: Normal range of motion.  Skin:    General: Skin is warm and dry.  Neurological:     General: No focal deficit present.     Mental Status: She is alert and oriented to person, place, and time. Mental status is at baseline.  Psychiatric:        Mood and Affect: Mood normal.        Behavior: Behavior normal.        Thought Content: Thought content normal.        Judgment: Judgment normal.      Lab Results:  CBC    Component Value Date/Time   WBC 8.2 03/04/2022 1715   RBC 4.71 03/04/2022 1715   HGB 12.8 03/04/2022 1715   HCT 37.7 03/04/2022 1715   PLT 272 03/04/2022 1715   MCV 80.0 03/04/2022 1715   MCH 27.2 03/04/2022 1715   MCHC 34.0 03/04/2022 1715   RDW 14.5 03/04/2022 1715   LYMPHSABS 1.6 03/04/2022 1715   MONOABS 0.7 03/04/2022 1715   EOSABS 0.2 03/04/2022 1715   BASOSABS 0.0 03/04/2022 1715    BMET    Component Value Date/Time   NA 136 03/04/2022 1715   K 3.5 03/04/2022 1715   CL 96 (L)  03/04/2022 1715   CO2 29 03/04/2022 1715   GLUCOSE 123 (H) 03/04/2022 1715   BUN 9 03/04/2022 1715   CREATININE 0.80 03/04/2022 1715   CALCIUM 9.5 03/04/2022 1715   GFRNONAA >60 03/04/2022 1715    BNP    Component Value Date/Time   BNP 20.2 02/07/2022 1616    ProBNP No results found for: "PROBNP"  Imaging: No  results found.   Assessment & Plan:   COPD (chronic obstructive pulmonary disease) (HCC) - COPD symptoms were stable up until 2 days ago. She had covid 3 weeks ago and was treated with antiviral but fully recovered.  She is currently in the middle of a mild exacerbation. Lung sounds were diminished on exam. VSS.  Received ipratropium-albuterol nebulizer in office x1.  We will check a chest x-ray today.  Sending in a course of Doxycyline 100mg  BID x 7 days and prednisone 20 mg x 5 days, 10 mg x 5 days.  We will also refill Breztri Aerosphere and albuterol. Advised she take Mucinex 600 mg twice daily for the next 5 to 7 days. FU in 3 motnhs with Dr. or sooner if needed.    Junie Spencer, NP 04/30/2022

## 2022-04-30 NOTE — Addendum Note (Signed)
Addended by: Almetta Lovely B on: 04/30/2022 10:28 AM   Modules accepted: Orders

## 2022-05-02 NOTE — Progress Notes (Signed)
Please let patient know CXR showed clear lungs, no active disease process

## 2022-09-30 ENCOUNTER — Telehealth: Payer: Self-pay | Admitting: Pulmonary Disease

## 2022-09-30 NOTE — Telephone Encounter (Signed)
Called patient to schedule overdue follow up- [place]. Nothing further needed.

## 2022-11-18 IMAGING — CT CT HEAD W/O CM
4 series · 16 of 47 positions shown, 18 images · non-contrast
Comparison: None.

CLINICAL DATA: Headache.

EXAM:
CT HEAD WITHOUT CONTRAST
TECHNIQUE: Contiguous axial images were obtained from the base of the skull
through the vertex without intravenous contrast.

[Series 3: head without · axial · non-contrast · 0.39mm/px · z∈[-138,-18]mm · 7 of 34 slices shown, 9 images]
[im 5/34  brain]
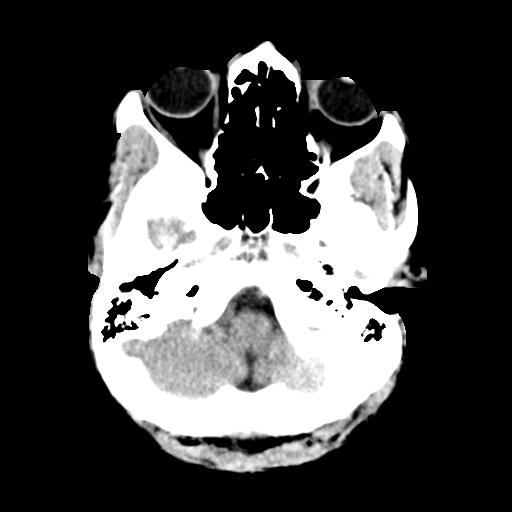
[im 5/34  bone]
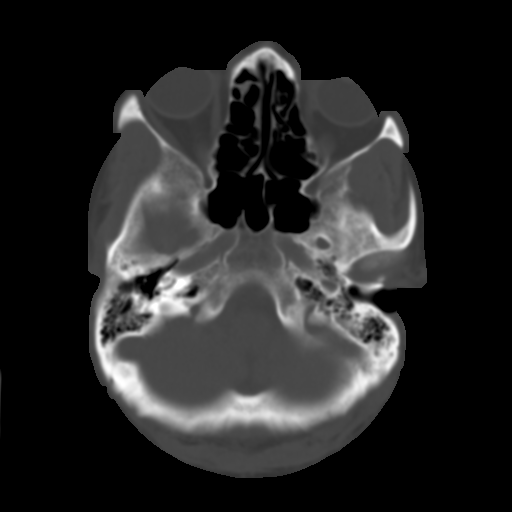
[im 9/34  brain]
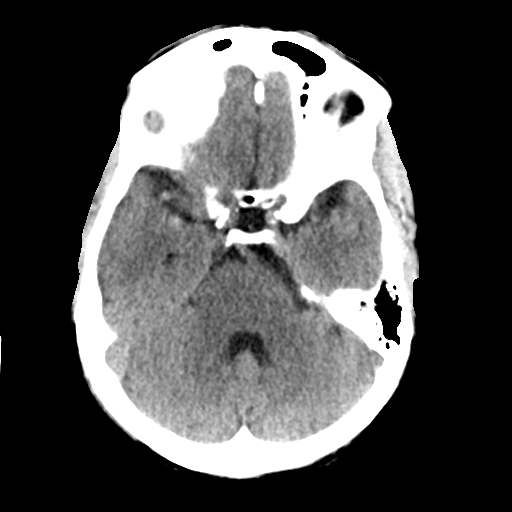
[im 13/34  brain]
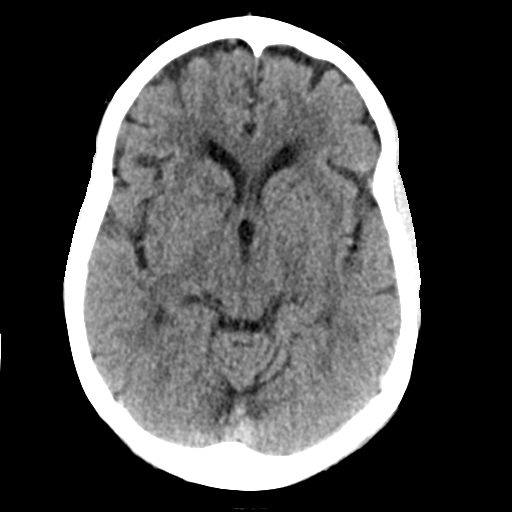
[im 17/34  brain]
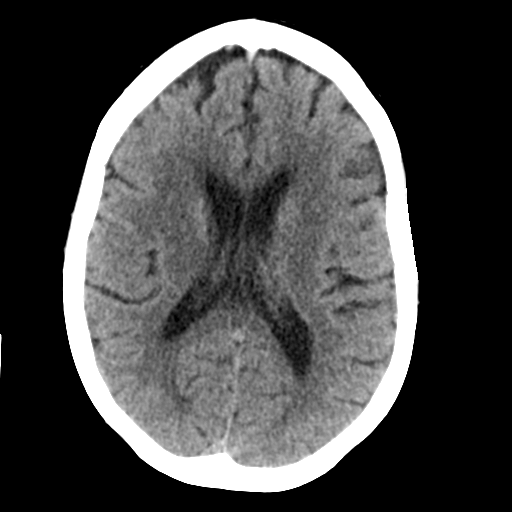
[im 21/34  brain]
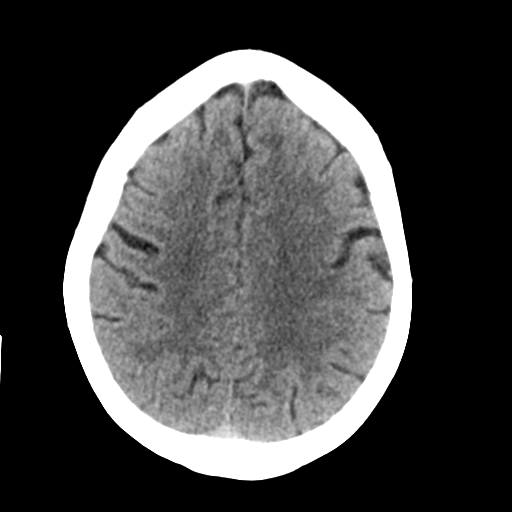
[im 21/34  bone]
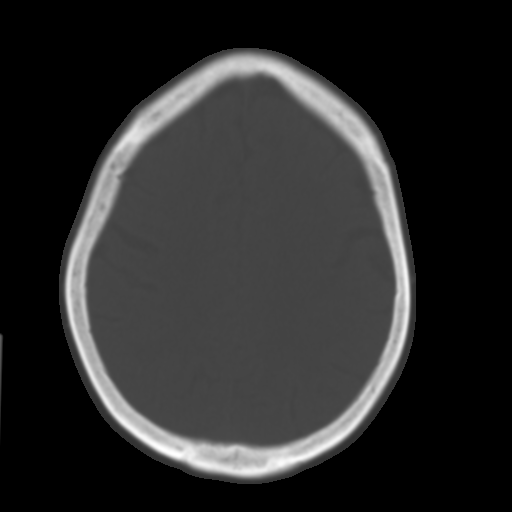
[im 25/34  brain]
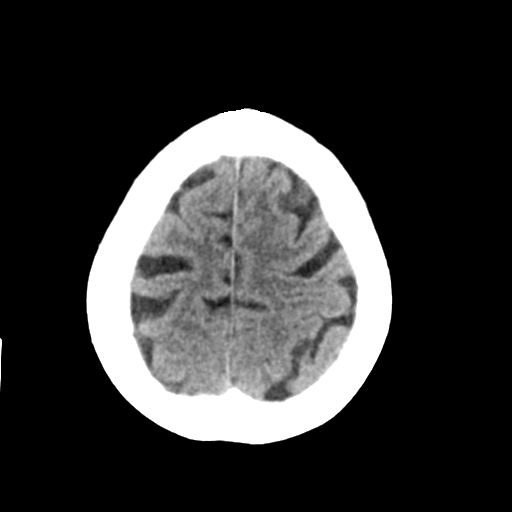
[im 29/34  brain]
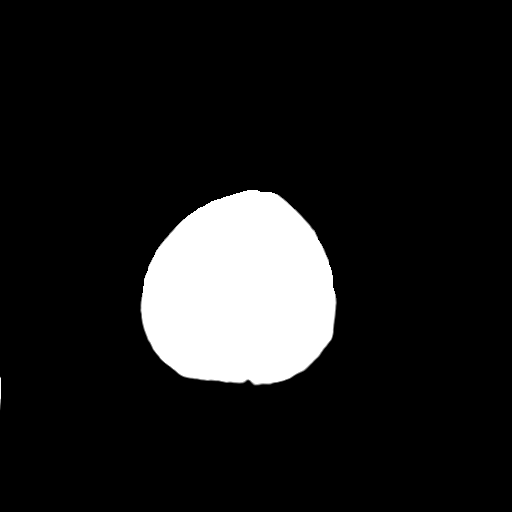

[Series 4: head bone · axial · 0.39mm/px · z∈[-142,-110]mm · 3 of 84 slices shown]
[im 9/84  bone]
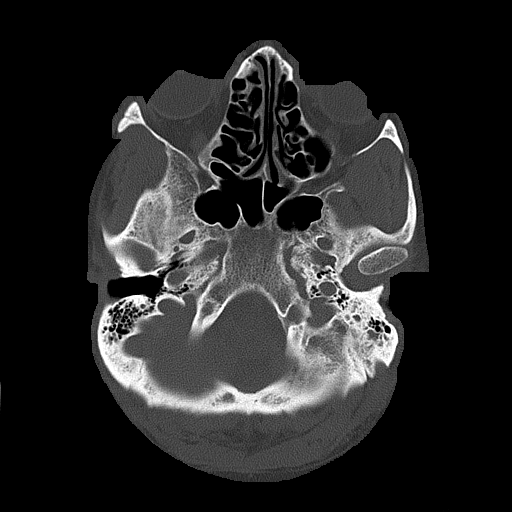
[im 17/84  bone]
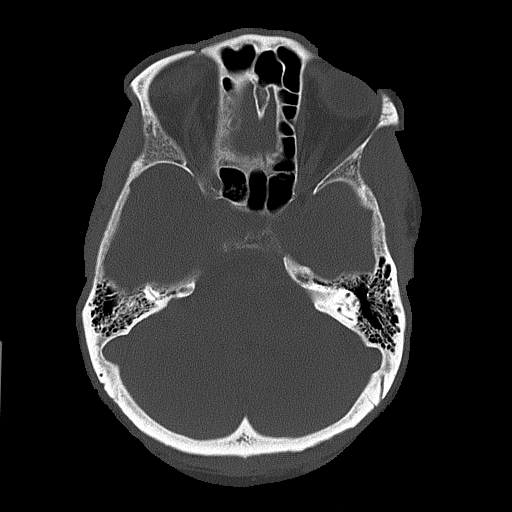
[im 25/84  bone]
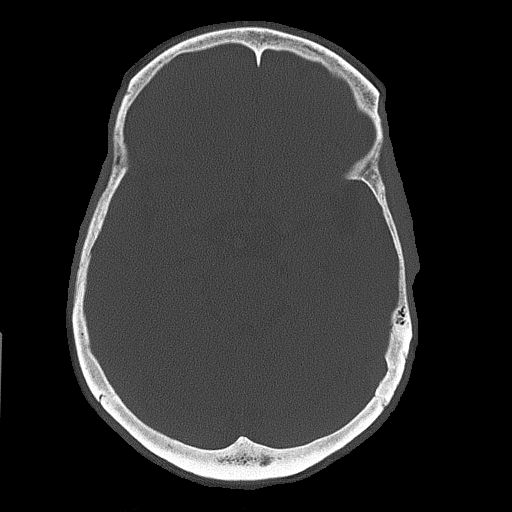

[Series 5: head without cor · coronal · non-contrast · 0.32mm/px · 3 of 67 slices shown]
[im 23/67  brain]
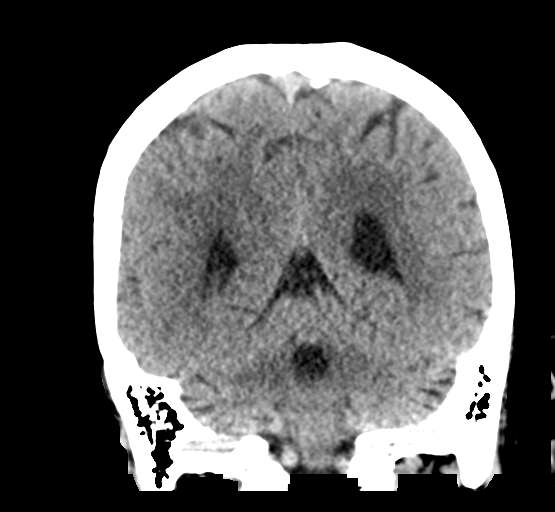
[im 30/67  brain]
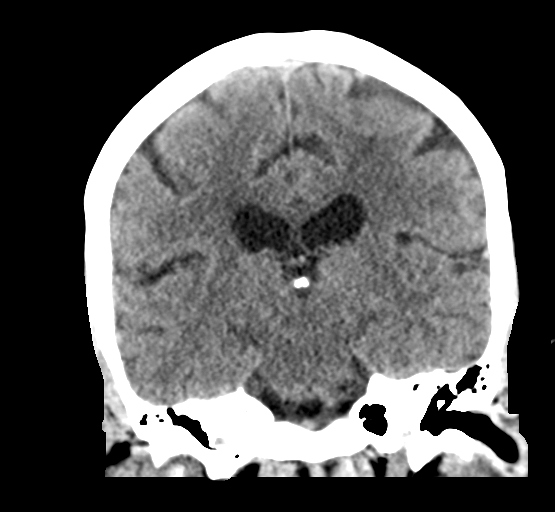
[im 37/67  brain]
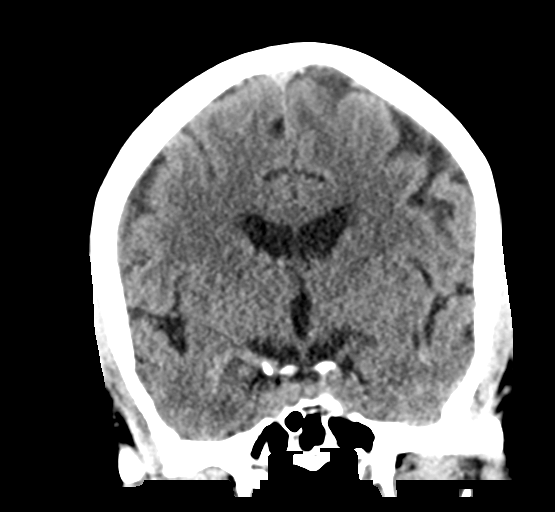

[Series 6: head without sag · sagittal · non-contrast · 0.32mm/px · 3 of 55 slices shown]
[im 19/55  brain]
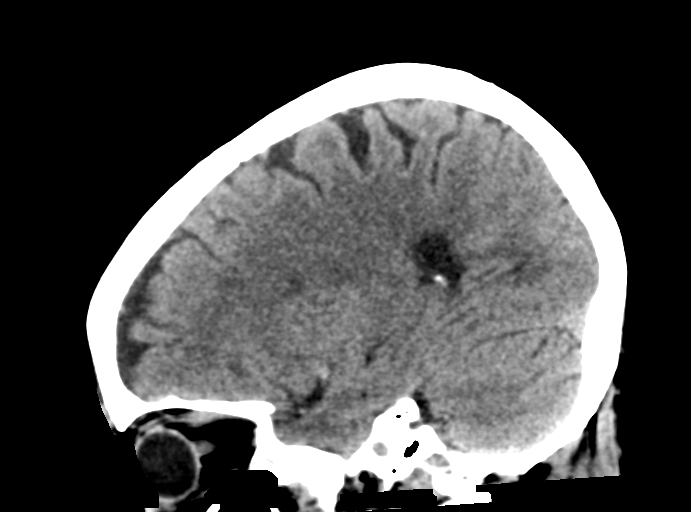
[im 28/55  brain]
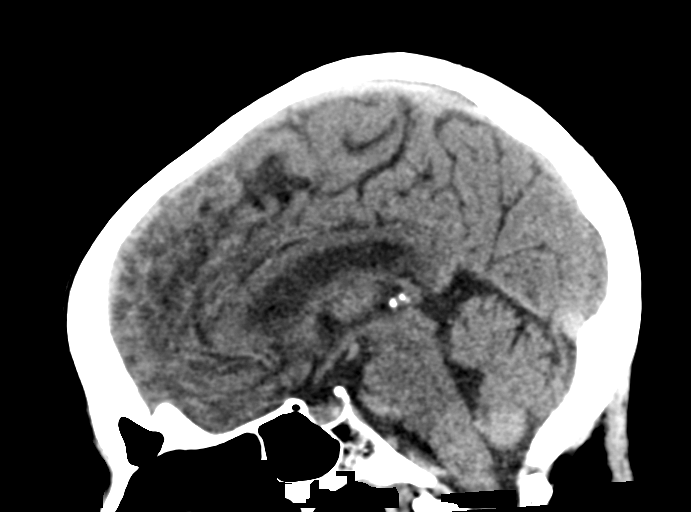
[im 37/55  brain]
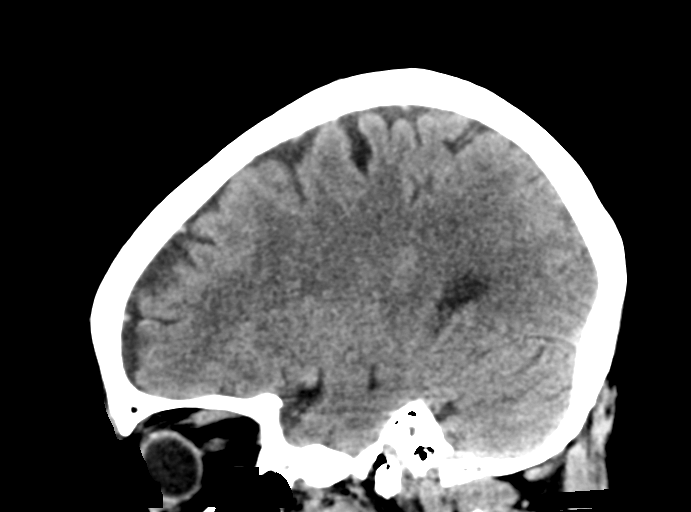

[16 of 47 positions shown; findings below may reference images not displayed]

FINDINGS: Brain: The ventricles and sulci appropriate size for patient's age.
Mild periventricular and deep white matter chronic microvascular
ischemic changes noted. There is no acute intracranial hemorrhage.
No mass effect or midline shift. No extra-axial fluid collection.

Vascular: No hyperdense vessel or unexpected calcification.

Skull: Normal. Negative for fracture or focal lesion.

Sinuses/Orbits: No acute finding.

Other: None
IMPRESSION: No acute intracranial pathology.

## 2022-11-18 IMAGING — CR DG CHEST 2V
2 series · 2 of 2 positions shown · non-contrast
Comparison: Chest x-ray 08/02/2021

CLINICAL DATA: Shortness of breath.

EXAM:
CHEST - 2 VIEW

[chest lat]
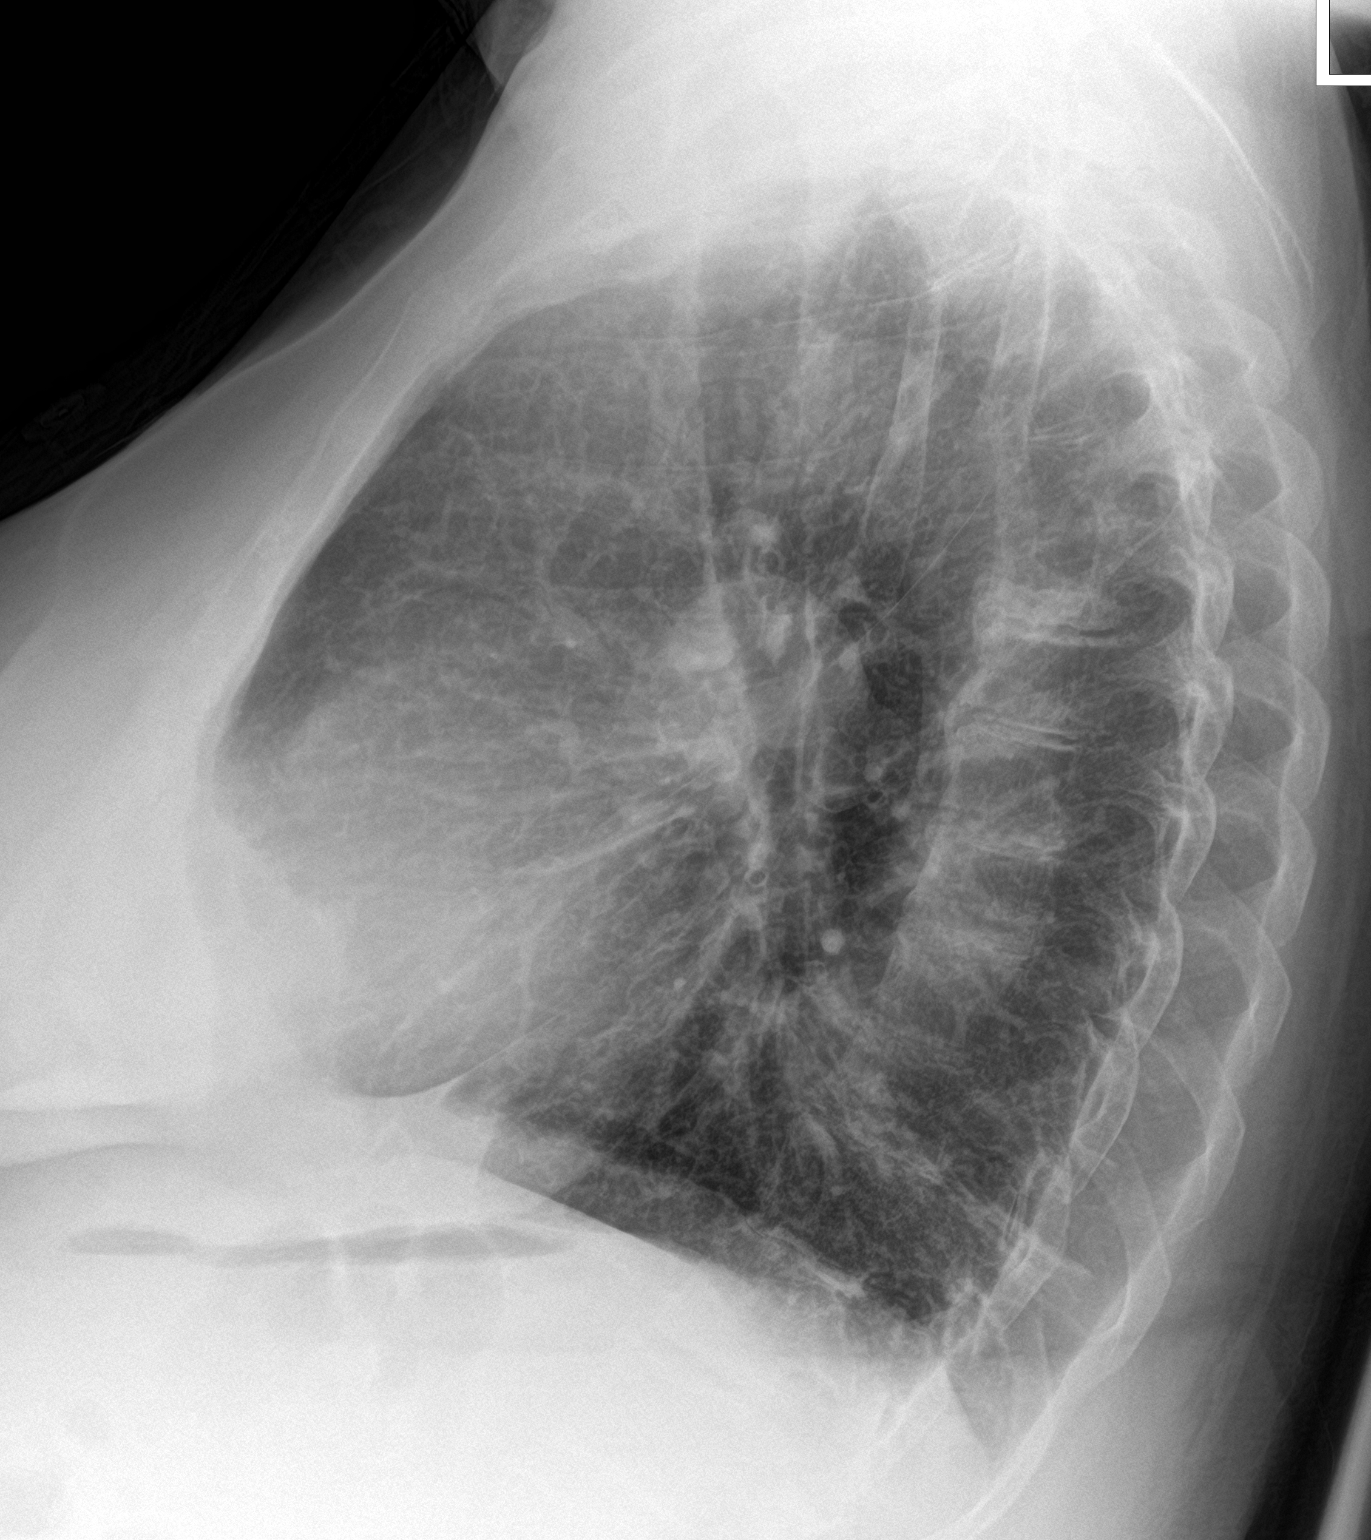

[chest ap]
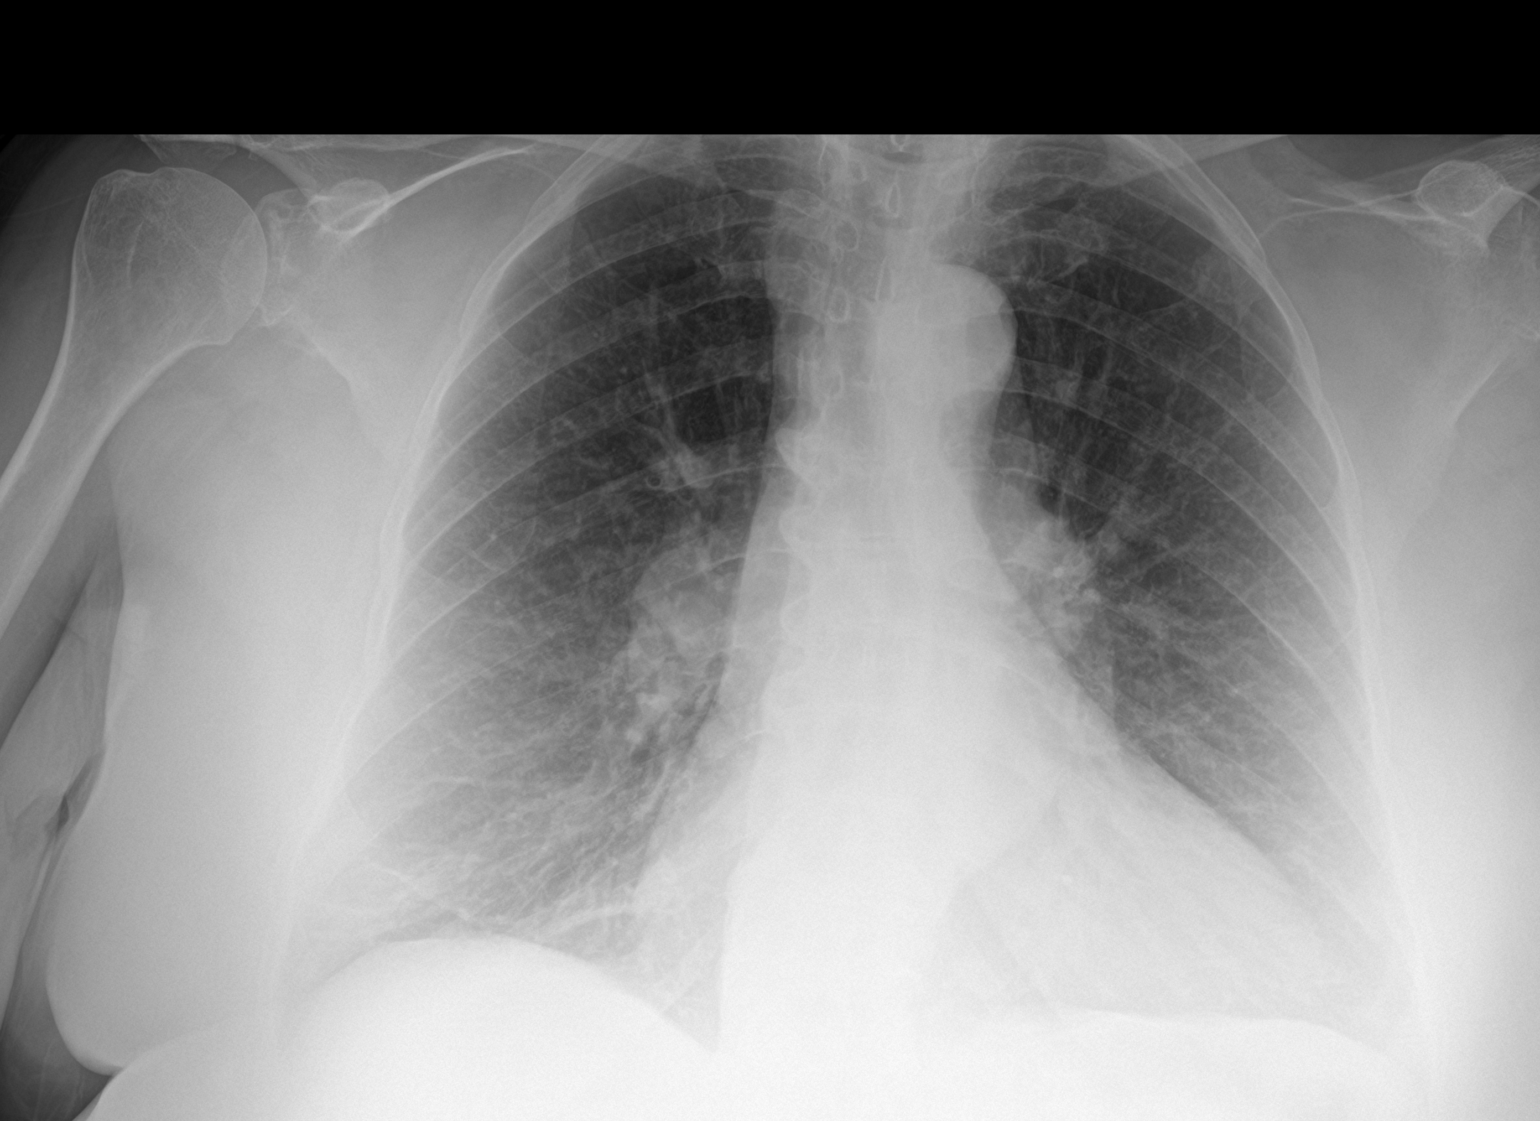

[2 of 2 positions shown; findings below may reference images not displayed]

FINDINGS: The heart size and mediastinal contours are within normal limits.
Both lungs are clear. The visualized skeletal structures are
unremarkable.
IMPRESSION: No active cardiopulmonary disease.
